# Patient Record
Sex: Male | Born: 1950 | Race: White | Hispanic: No | Marital: Married | Smoking: Former smoker
Health system: Southern US, Community
[De-identification: ages and names within clinical notes are randomized; demographics above are authoritative.]

## PROBLEM LIST (undated history)

## (undated) DIAGNOSIS — I440 Atrioventricular block, first degree: Secondary | ICD-10-CM

## (undated) DIAGNOSIS — E11319 Type 2 diabetes mellitus with unspecified diabetic retinopathy without macular edema: Secondary | ICD-10-CM

## (undated) DIAGNOSIS — E559 Vitamin D deficiency, unspecified: Secondary | ICD-10-CM

## (undated) DIAGNOSIS — K219 Gastro-esophageal reflux disease without esophagitis: Secondary | ICD-10-CM

## (undated) DIAGNOSIS — R5382 Chronic fatigue, unspecified: Secondary | ICD-10-CM

## (undated) DIAGNOSIS — M159 Polyosteoarthritis, unspecified: Secondary | ICD-10-CM

## (undated) DIAGNOSIS — N200 Calculus of kidney: Secondary | ICD-10-CM

## (undated) DIAGNOSIS — E7800 Pure hypercholesterolemia, unspecified: Secondary | ICD-10-CM

## (undated) DIAGNOSIS — D519 Vitamin B12 deficiency anemia, unspecified: Secondary | ICD-10-CM

## (undated) DIAGNOSIS — E785 Hyperlipidemia, unspecified: Secondary | ICD-10-CM

## (undated) DIAGNOSIS — E119 Type 2 diabetes mellitus without complications: Secondary | ICD-10-CM

## (undated) DIAGNOSIS — F3341 Major depressive disorder, recurrent, in partial remission: Secondary | ICD-10-CM

## (undated) DIAGNOSIS — F419 Anxiety disorder, unspecified: Secondary | ICD-10-CM

## (undated) HISTORY — DX: Calculus of kidney: N20.0

## (undated) HISTORY — DX: Pure hypercholesterolemia, unspecified: E78.00

## (undated) HISTORY — DX: Anxiety disorder, unspecified: F41.9

## (undated) HISTORY — DX: Gastro-esophageal reflux disease without esophagitis: K21.9

## (undated) HISTORY — DX: Major depressive disorder, recurrent, in partial remission: F33.41

## (undated) HISTORY — DX: Vitamin B12 deficiency anemia, unspecified: D51.9

## (undated) HISTORY — DX: Type 2 diabetes mellitus without complications: E11.9

## (undated) HISTORY — DX: Vitamin D deficiency, unspecified: E55.9

## (undated) HISTORY — DX: Chronic fatigue, unspecified: R53.82

## (undated) HISTORY — DX: Type 2 diabetes mellitus with unspecified diabetic retinopathy without macular edema: E11.319

## (undated) HISTORY — DX: Hyperlipidemia, unspecified: E78.5

## (undated) HISTORY — DX: Atrioventricular block, first degree: I44.0

## (undated) HISTORY — DX: Polyosteoarthritis, unspecified: M15.9

## (undated) HISTORY — PX: REPLACEMENT TOTAL KNEE: SUR1224

---

## 2014-05-17 DIAGNOSIS — N200 Calculus of kidney: Secondary | ICD-10-CM | POA: Diagnosis not present

## 2014-05-17 DIAGNOSIS — E780 Pure hypercholesterolemia: Secondary | ICD-10-CM | POA: Diagnosis not present

## 2014-05-17 DIAGNOSIS — K219 Gastro-esophageal reflux disease without esophagitis: Secondary | ICD-10-CM | POA: Diagnosis not present

## 2014-05-17 DIAGNOSIS — Z9181 History of falling: Secondary | ICD-10-CM | POA: Diagnosis not present

## 2014-05-17 DIAGNOSIS — Z6823 Body mass index (BMI) 23.0-23.9, adult: Secondary | ICD-10-CM | POA: Diagnosis not present

## 2014-05-17 DIAGNOSIS — Z0000 Encounter for general adult medical examination without abnormal findings: Secondary | ICD-10-CM | POA: Diagnosis not present

## 2014-05-17 DIAGNOSIS — Z1389 Encounter for screening for other disorder: Secondary | ICD-10-CM | POA: Diagnosis not present

## 2014-05-17 DIAGNOSIS — J0100 Acute maxillary sinusitis, unspecified: Secondary | ICD-10-CM | POA: Diagnosis not present

## 2014-05-17 DIAGNOSIS — M159 Polyosteoarthritis, unspecified: Secondary | ICD-10-CM | POA: Diagnosis not present

## 2014-05-17 DIAGNOSIS — E119 Type 2 diabetes mellitus without complications: Secondary | ICD-10-CM | POA: Diagnosis not present

## 2014-07-17 DIAGNOSIS — N200 Calculus of kidney: Secondary | ICD-10-CM | POA: Diagnosis not present

## 2014-07-17 DIAGNOSIS — E119 Type 2 diabetes mellitus without complications: Secondary | ICD-10-CM | POA: Diagnosis not present

## 2014-07-17 DIAGNOSIS — M159 Polyosteoarthritis, unspecified: Secondary | ICD-10-CM | POA: Diagnosis not present

## 2014-07-17 DIAGNOSIS — E780 Pure hypercholesterolemia: Secondary | ICD-10-CM | POA: Diagnosis not present

## 2014-07-17 DIAGNOSIS — K219 Gastro-esophageal reflux disease without esophagitis: Secondary | ICD-10-CM | POA: Diagnosis not present

## 2014-07-17 DIAGNOSIS — Z6823 Body mass index (BMI) 23.0-23.9, adult: Secondary | ICD-10-CM | POA: Diagnosis not present

## 2014-09-19 DIAGNOSIS — E119 Type 2 diabetes mellitus without complications: Secondary | ICD-10-CM | POA: Diagnosis not present

## 2014-09-19 DIAGNOSIS — M159 Polyosteoarthritis, unspecified: Secondary | ICD-10-CM | POA: Diagnosis not present

## 2014-09-19 DIAGNOSIS — E780 Pure hypercholesterolemia: Secondary | ICD-10-CM | POA: Diagnosis not present

## 2014-09-19 DIAGNOSIS — Z1389 Encounter for screening for other disorder: Secondary | ICD-10-CM | POA: Diagnosis not present

## 2014-09-19 DIAGNOSIS — K219 Gastro-esophageal reflux disease without esophagitis: Secondary | ICD-10-CM | POA: Diagnosis not present

## 2014-09-19 DIAGNOSIS — Z6824 Body mass index (BMI) 24.0-24.9, adult: Secondary | ICD-10-CM | POA: Diagnosis not present

## 2014-09-19 DIAGNOSIS — N200 Calculus of kidney: Secondary | ICD-10-CM | POA: Diagnosis not present

## 2014-11-12 DIAGNOSIS — H5203 Hypermetropia, bilateral: Secondary | ICD-10-CM | POA: Diagnosis not present

## 2014-11-12 DIAGNOSIS — E119 Type 2 diabetes mellitus without complications: Secondary | ICD-10-CM | POA: Diagnosis not present

## 2014-11-12 DIAGNOSIS — H5210 Myopia, unspecified eye: Secondary | ICD-10-CM | POA: Diagnosis not present

## 2014-11-21 DIAGNOSIS — M159 Polyosteoarthritis, unspecified: Secondary | ICD-10-CM | POA: Diagnosis not present

## 2014-11-21 DIAGNOSIS — Z6825 Body mass index (BMI) 25.0-25.9, adult: Secondary | ICD-10-CM | POA: Diagnosis not present

## 2014-11-21 DIAGNOSIS — E119 Type 2 diabetes mellitus without complications: Secondary | ICD-10-CM | POA: Diagnosis not present

## 2014-11-21 DIAGNOSIS — K219 Gastro-esophageal reflux disease without esophagitis: Secondary | ICD-10-CM | POA: Diagnosis not present

## 2014-11-21 DIAGNOSIS — N200 Calculus of kidney: Secondary | ICD-10-CM | POA: Diagnosis not present

## 2014-11-21 DIAGNOSIS — E780 Pure hypercholesterolemia: Secondary | ICD-10-CM | POA: Diagnosis not present

## 2014-11-21 DIAGNOSIS — M25561 Pain in right knee: Secondary | ICD-10-CM | POA: Diagnosis not present

## 2014-11-26 DIAGNOSIS — Z01818 Encounter for other preprocedural examination: Secondary | ICD-10-CM | POA: Diagnosis not present

## 2014-11-26 DIAGNOSIS — M170 Bilateral primary osteoarthritis of knee: Secondary | ICD-10-CM | POA: Diagnosis not present

## 2014-11-26 DIAGNOSIS — Z9229 Personal history of other drug therapy: Secondary | ICD-10-CM | POA: Diagnosis not present

## 2014-11-26 DIAGNOSIS — R52 Pain, unspecified: Secondary | ICD-10-CM | POA: Diagnosis not present

## 2014-11-26 DIAGNOSIS — I440 Atrioventricular block, first degree: Secondary | ICD-10-CM | POA: Diagnosis not present

## 2014-11-26 DIAGNOSIS — R001 Bradycardia, unspecified: Secondary | ICD-10-CM | POA: Diagnosis not present

## 2014-12-04 DIAGNOSIS — Z01810 Encounter for preprocedural cardiovascular examination: Secondary | ICD-10-CM | POA: Diagnosis not present

## 2014-12-04 DIAGNOSIS — Z6825 Body mass index (BMI) 25.0-25.9, adult: Secondary | ICD-10-CM | POA: Diagnosis not present

## 2014-12-04 DIAGNOSIS — E780 Pure hypercholesterolemia: Secondary | ICD-10-CM | POA: Diagnosis not present

## 2014-12-04 DIAGNOSIS — M159 Polyosteoarthritis, unspecified: Secondary | ICD-10-CM | POA: Diagnosis not present

## 2014-12-04 DIAGNOSIS — K219 Gastro-esophageal reflux disease without esophagitis: Secondary | ICD-10-CM | POA: Diagnosis not present

## 2014-12-04 DIAGNOSIS — E119 Type 2 diabetes mellitus without complications: Secondary | ICD-10-CM | POA: Diagnosis not present

## 2014-12-04 DIAGNOSIS — N200 Calculus of kidney: Secondary | ICD-10-CM | POA: Diagnosis not present

## 2014-12-12 DIAGNOSIS — M170 Bilateral primary osteoarthritis of knee: Secondary | ICD-10-CM | POA: Diagnosis not present

## 2014-12-18 DIAGNOSIS — Z471 Aftercare following joint replacement surgery: Secondary | ICD-10-CM | POA: Diagnosis not present

## 2014-12-18 DIAGNOSIS — Z87891 Personal history of nicotine dependence: Secondary | ICD-10-CM | POA: Diagnosis not present

## 2014-12-18 DIAGNOSIS — Z01818 Encounter for other preprocedural examination: Secondary | ICD-10-CM | POA: Diagnosis not present

## 2014-12-18 DIAGNOSIS — K219 Gastro-esophageal reflux disease without esophagitis: Secondary | ICD-10-CM | POA: Diagnosis not present

## 2014-12-18 DIAGNOSIS — M1712 Unilateral primary osteoarthritis, left knee: Secondary | ICD-10-CM | POA: Diagnosis not present

## 2014-12-18 DIAGNOSIS — E780 Pure hypercholesterolemia: Secondary | ICD-10-CM | POA: Diagnosis not present

## 2014-12-18 DIAGNOSIS — G8918 Other acute postprocedural pain: Secondary | ICD-10-CM | POA: Diagnosis not present

## 2014-12-18 DIAGNOSIS — M170 Bilateral primary osteoarthritis of knee: Secondary | ICD-10-CM | POA: Diagnosis not present

## 2014-12-18 DIAGNOSIS — R52 Pain, unspecified: Secondary | ICD-10-CM | POA: Diagnosis not present

## 2014-12-18 DIAGNOSIS — Z79899 Other long term (current) drug therapy: Secondary | ICD-10-CM | POA: Diagnosis not present

## 2014-12-18 DIAGNOSIS — E119 Type 2 diabetes mellitus without complications: Secondary | ICD-10-CM | POA: Diagnosis not present

## 2014-12-18 DIAGNOSIS — Z96652 Presence of left artificial knee joint: Secondary | ICD-10-CM | POA: Diagnosis not present

## 2014-12-19 DIAGNOSIS — E119 Type 2 diabetes mellitus without complications: Secondary | ICD-10-CM | POA: Diagnosis not present

## 2014-12-19 DIAGNOSIS — M170 Bilateral primary osteoarthritis of knee: Secondary | ICD-10-CM | POA: Diagnosis not present

## 2014-12-19 DIAGNOSIS — E780 Pure hypercholesterolemia: Secondary | ICD-10-CM | POA: Diagnosis not present

## 2014-12-19 DIAGNOSIS — K219 Gastro-esophageal reflux disease without esophagitis: Secondary | ICD-10-CM | POA: Diagnosis not present

## 2014-12-19 DIAGNOSIS — Z87891 Personal history of nicotine dependence: Secondary | ICD-10-CM | POA: Diagnosis not present

## 2014-12-20 DIAGNOSIS — E780 Pure hypercholesterolemia: Secondary | ICD-10-CM | POA: Diagnosis not present

## 2014-12-20 DIAGNOSIS — M170 Bilateral primary osteoarthritis of knee: Secondary | ICD-10-CM | POA: Diagnosis not present

## 2014-12-20 DIAGNOSIS — K219 Gastro-esophageal reflux disease without esophagitis: Secondary | ICD-10-CM | POA: Diagnosis not present

## 2014-12-20 DIAGNOSIS — Z87891 Personal history of nicotine dependence: Secondary | ICD-10-CM | POA: Diagnosis not present

## 2014-12-20 DIAGNOSIS — E119 Type 2 diabetes mellitus without complications: Secondary | ICD-10-CM | POA: Diagnosis not present

## 2014-12-22 DIAGNOSIS — Z471 Aftercare following joint replacement surgery: Secondary | ICD-10-CM | POA: Diagnosis not present

## 2014-12-22 DIAGNOSIS — E780 Pure hypercholesterolemia: Secondary | ICD-10-CM | POA: Diagnosis not present

## 2014-12-22 DIAGNOSIS — K219 Gastro-esophageal reflux disease without esophagitis: Secondary | ICD-10-CM | POA: Diagnosis not present

## 2014-12-22 DIAGNOSIS — R269 Unspecified abnormalities of gait and mobility: Secondary | ICD-10-CM | POA: Diagnosis not present

## 2014-12-22 DIAGNOSIS — E119 Type 2 diabetes mellitus without complications: Secondary | ICD-10-CM | POA: Diagnosis not present

## 2014-12-22 DIAGNOSIS — Z96652 Presence of left artificial knee joint: Secondary | ICD-10-CM | POA: Diagnosis not present

## 2014-12-23 DIAGNOSIS — E119 Type 2 diabetes mellitus without complications: Secondary | ICD-10-CM | POA: Diagnosis not present

## 2014-12-23 DIAGNOSIS — Z96652 Presence of left artificial knee joint: Secondary | ICD-10-CM | POA: Diagnosis not present

## 2014-12-23 DIAGNOSIS — E780 Pure hypercholesterolemia: Secondary | ICD-10-CM | POA: Diagnosis not present

## 2014-12-23 DIAGNOSIS — Z471 Aftercare following joint replacement surgery: Secondary | ICD-10-CM | POA: Diagnosis not present

## 2014-12-23 DIAGNOSIS — R269 Unspecified abnormalities of gait and mobility: Secondary | ICD-10-CM | POA: Diagnosis not present

## 2014-12-23 DIAGNOSIS — K219 Gastro-esophageal reflux disease without esophagitis: Secondary | ICD-10-CM | POA: Diagnosis not present

## 2014-12-24 DIAGNOSIS — K219 Gastro-esophageal reflux disease without esophagitis: Secondary | ICD-10-CM | POA: Diagnosis not present

## 2014-12-24 DIAGNOSIS — Z6825 Body mass index (BMI) 25.0-25.9, adult: Secondary | ICD-10-CM | POA: Diagnosis not present

## 2014-12-24 DIAGNOSIS — R269 Unspecified abnormalities of gait and mobility: Secondary | ICD-10-CM | POA: Diagnosis not present

## 2014-12-24 DIAGNOSIS — M159 Polyosteoarthritis, unspecified: Secondary | ICD-10-CM | POA: Diagnosis not present

## 2014-12-24 DIAGNOSIS — Z471 Aftercare following joint replacement surgery: Secondary | ICD-10-CM | POA: Diagnosis not present

## 2014-12-24 DIAGNOSIS — N200 Calculus of kidney: Secondary | ICD-10-CM | POA: Diagnosis not present

## 2014-12-24 DIAGNOSIS — E780 Pure hypercholesterolemia: Secondary | ICD-10-CM | POA: Diagnosis not present

## 2014-12-24 DIAGNOSIS — Z96652 Presence of left artificial knee joint: Secondary | ICD-10-CM | POA: Diagnosis not present

## 2014-12-24 DIAGNOSIS — E119 Type 2 diabetes mellitus without complications: Secondary | ICD-10-CM | POA: Diagnosis not present

## 2014-12-25 DIAGNOSIS — R269 Unspecified abnormalities of gait and mobility: Secondary | ICD-10-CM | POA: Diagnosis not present

## 2014-12-25 DIAGNOSIS — E780 Pure hypercholesterolemia: Secondary | ICD-10-CM | POA: Diagnosis not present

## 2014-12-25 DIAGNOSIS — Z471 Aftercare following joint replacement surgery: Secondary | ICD-10-CM | POA: Diagnosis not present

## 2014-12-25 DIAGNOSIS — K219 Gastro-esophageal reflux disease without esophagitis: Secondary | ICD-10-CM | POA: Diagnosis not present

## 2014-12-25 DIAGNOSIS — Z96652 Presence of left artificial knee joint: Secondary | ICD-10-CM | POA: Diagnosis not present

## 2014-12-25 DIAGNOSIS — E119 Type 2 diabetes mellitus without complications: Secondary | ICD-10-CM | POA: Diagnosis not present

## 2014-12-26 DIAGNOSIS — K219 Gastro-esophageal reflux disease without esophagitis: Secondary | ICD-10-CM | POA: Diagnosis not present

## 2014-12-26 DIAGNOSIS — Z96652 Presence of left artificial knee joint: Secondary | ICD-10-CM | POA: Diagnosis not present

## 2014-12-26 DIAGNOSIS — E780 Pure hypercholesterolemia: Secondary | ICD-10-CM | POA: Diagnosis not present

## 2014-12-26 DIAGNOSIS — E119 Type 2 diabetes mellitus without complications: Secondary | ICD-10-CM | POA: Diagnosis not present

## 2014-12-26 DIAGNOSIS — Z471 Aftercare following joint replacement surgery: Secondary | ICD-10-CM | POA: Diagnosis not present

## 2014-12-26 DIAGNOSIS — R269 Unspecified abnormalities of gait and mobility: Secondary | ICD-10-CM | POA: Diagnosis not present

## 2014-12-27 DIAGNOSIS — R269 Unspecified abnormalities of gait and mobility: Secondary | ICD-10-CM | POA: Diagnosis not present

## 2014-12-27 DIAGNOSIS — Z96652 Presence of left artificial knee joint: Secondary | ICD-10-CM | POA: Diagnosis not present

## 2014-12-27 DIAGNOSIS — K219 Gastro-esophageal reflux disease without esophagitis: Secondary | ICD-10-CM | POA: Diagnosis not present

## 2014-12-27 DIAGNOSIS — E119 Type 2 diabetes mellitus without complications: Secondary | ICD-10-CM | POA: Diagnosis not present

## 2014-12-27 DIAGNOSIS — Z471 Aftercare following joint replacement surgery: Secondary | ICD-10-CM | POA: Diagnosis not present

## 2014-12-27 DIAGNOSIS — E780 Pure hypercholesterolemia: Secondary | ICD-10-CM | POA: Diagnosis not present

## 2014-12-30 DIAGNOSIS — K219 Gastro-esophageal reflux disease without esophagitis: Secondary | ICD-10-CM | POA: Diagnosis not present

## 2014-12-30 DIAGNOSIS — Z96652 Presence of left artificial knee joint: Secondary | ICD-10-CM | POA: Diagnosis not present

## 2014-12-30 DIAGNOSIS — E780 Pure hypercholesterolemia: Secondary | ICD-10-CM | POA: Diagnosis not present

## 2014-12-30 DIAGNOSIS — Z471 Aftercare following joint replacement surgery: Secondary | ICD-10-CM | POA: Diagnosis not present

## 2014-12-30 DIAGNOSIS — R269 Unspecified abnormalities of gait and mobility: Secondary | ICD-10-CM | POA: Diagnosis not present

## 2014-12-30 DIAGNOSIS — E119 Type 2 diabetes mellitus without complications: Secondary | ICD-10-CM | POA: Diagnosis not present

## 2015-01-01 DIAGNOSIS — Z471 Aftercare following joint replacement surgery: Secondary | ICD-10-CM | POA: Diagnosis not present

## 2015-01-01 DIAGNOSIS — R269 Unspecified abnormalities of gait and mobility: Secondary | ICD-10-CM | POA: Diagnosis not present

## 2015-01-01 DIAGNOSIS — M170 Bilateral primary osteoarthritis of knee: Secondary | ICD-10-CM | POA: Diagnosis not present

## 2015-01-01 DIAGNOSIS — Z96652 Presence of left artificial knee joint: Secondary | ICD-10-CM | POA: Diagnosis not present

## 2015-01-03 DIAGNOSIS — R269 Unspecified abnormalities of gait and mobility: Secondary | ICD-10-CM | POA: Diagnosis not present

## 2015-01-03 DIAGNOSIS — Z96652 Presence of left artificial knee joint: Secondary | ICD-10-CM | POA: Diagnosis not present

## 2015-01-03 DIAGNOSIS — Z471 Aftercare following joint replacement surgery: Secondary | ICD-10-CM | POA: Diagnosis not present

## 2015-01-03 DIAGNOSIS — M170 Bilateral primary osteoarthritis of knee: Secondary | ICD-10-CM | POA: Diagnosis not present

## 2015-01-07 DIAGNOSIS — M170 Bilateral primary osteoarthritis of knee: Secondary | ICD-10-CM | POA: Diagnosis not present

## 2015-01-07 DIAGNOSIS — N200 Calculus of kidney: Secondary | ICD-10-CM | POA: Diagnosis not present

## 2015-01-07 DIAGNOSIS — M159 Polyosteoarthritis, unspecified: Secondary | ICD-10-CM | POA: Diagnosis not present

## 2015-01-07 DIAGNOSIS — K219 Gastro-esophageal reflux disease without esophagitis: Secondary | ICD-10-CM | POA: Diagnosis not present

## 2015-01-07 DIAGNOSIS — Z96652 Presence of left artificial knee joint: Secondary | ICD-10-CM | POA: Diagnosis not present

## 2015-01-07 DIAGNOSIS — E780 Pure hypercholesterolemia: Secondary | ICD-10-CM | POA: Diagnosis not present

## 2015-01-07 DIAGNOSIS — Z471 Aftercare following joint replacement surgery: Secondary | ICD-10-CM | POA: Diagnosis not present

## 2015-01-07 DIAGNOSIS — E119 Type 2 diabetes mellitus without complications: Secondary | ICD-10-CM | POA: Diagnosis not present

## 2015-01-07 DIAGNOSIS — Z6824 Body mass index (BMI) 24.0-24.9, adult: Secondary | ICD-10-CM | POA: Diagnosis not present

## 2015-01-07 DIAGNOSIS — R269 Unspecified abnormalities of gait and mobility: Secondary | ICD-10-CM | POA: Diagnosis not present

## 2015-01-10 DIAGNOSIS — Z471 Aftercare following joint replacement surgery: Secondary | ICD-10-CM | POA: Diagnosis not present

## 2015-01-10 DIAGNOSIS — Z96652 Presence of left artificial knee joint: Secondary | ICD-10-CM | POA: Diagnosis not present

## 2015-01-10 DIAGNOSIS — M170 Bilateral primary osteoarthritis of knee: Secondary | ICD-10-CM | POA: Diagnosis not present

## 2015-01-10 DIAGNOSIS — R269 Unspecified abnormalities of gait and mobility: Secondary | ICD-10-CM | POA: Diagnosis not present

## 2015-01-14 DIAGNOSIS — R269 Unspecified abnormalities of gait and mobility: Secondary | ICD-10-CM | POA: Diagnosis not present

## 2015-01-14 DIAGNOSIS — M170 Bilateral primary osteoarthritis of knee: Secondary | ICD-10-CM | POA: Diagnosis not present

## 2015-01-14 DIAGNOSIS — Z471 Aftercare following joint replacement surgery: Secondary | ICD-10-CM | POA: Diagnosis not present

## 2015-01-14 DIAGNOSIS — Z96652 Presence of left artificial knee joint: Secondary | ICD-10-CM | POA: Diagnosis not present

## 2015-01-16 DIAGNOSIS — Z471 Aftercare following joint replacement surgery: Secondary | ICD-10-CM | POA: Diagnosis not present

## 2015-01-16 DIAGNOSIS — R269 Unspecified abnormalities of gait and mobility: Secondary | ICD-10-CM | POA: Diagnosis not present

## 2015-01-16 DIAGNOSIS — M170 Bilateral primary osteoarthritis of knee: Secondary | ICD-10-CM | POA: Diagnosis not present

## 2015-01-16 DIAGNOSIS — Z96652 Presence of left artificial knee joint: Secondary | ICD-10-CM | POA: Diagnosis not present

## 2015-01-20 DIAGNOSIS — R269 Unspecified abnormalities of gait and mobility: Secondary | ICD-10-CM | POA: Diagnosis not present

## 2015-01-20 DIAGNOSIS — Z471 Aftercare following joint replacement surgery: Secondary | ICD-10-CM | POA: Diagnosis not present

## 2015-01-20 DIAGNOSIS — M170 Bilateral primary osteoarthritis of knee: Secondary | ICD-10-CM | POA: Diagnosis not present

## 2015-01-20 DIAGNOSIS — Z96652 Presence of left artificial knee joint: Secondary | ICD-10-CM | POA: Diagnosis not present

## 2015-01-21 DIAGNOSIS — N200 Calculus of kidney: Secondary | ICD-10-CM | POA: Diagnosis not present

## 2015-01-21 DIAGNOSIS — E780 Pure hypercholesterolemia: Secondary | ICD-10-CM | POA: Diagnosis not present

## 2015-01-21 DIAGNOSIS — M159 Polyosteoarthritis, unspecified: Secondary | ICD-10-CM | POA: Diagnosis not present

## 2015-01-21 DIAGNOSIS — E119 Type 2 diabetes mellitus without complications: Secondary | ICD-10-CM | POA: Diagnosis not present

## 2015-01-21 DIAGNOSIS — Z6824 Body mass index (BMI) 24.0-24.9, adult: Secondary | ICD-10-CM | POA: Diagnosis not present

## 2015-01-21 DIAGNOSIS — K219 Gastro-esophageal reflux disease without esophagitis: Secondary | ICD-10-CM | POA: Diagnosis not present

## 2015-01-23 DIAGNOSIS — Z96652 Presence of left artificial knee joint: Secondary | ICD-10-CM | POA: Diagnosis not present

## 2015-01-23 DIAGNOSIS — R269 Unspecified abnormalities of gait and mobility: Secondary | ICD-10-CM | POA: Diagnosis not present

## 2015-01-23 DIAGNOSIS — M170 Bilateral primary osteoarthritis of knee: Secondary | ICD-10-CM | POA: Diagnosis not present

## 2015-01-23 DIAGNOSIS — Z471 Aftercare following joint replacement surgery: Secondary | ICD-10-CM | POA: Diagnosis not present

## 2015-01-27 DIAGNOSIS — Z96652 Presence of left artificial knee joint: Secondary | ICD-10-CM | POA: Diagnosis not present

## 2015-01-27 DIAGNOSIS — M170 Bilateral primary osteoarthritis of knee: Secondary | ICD-10-CM | POA: Diagnosis not present

## 2015-01-27 DIAGNOSIS — R269 Unspecified abnormalities of gait and mobility: Secondary | ICD-10-CM | POA: Diagnosis not present

## 2015-01-27 DIAGNOSIS — Z471 Aftercare following joint replacement surgery: Secondary | ICD-10-CM | POA: Diagnosis not present

## 2015-01-28 DIAGNOSIS — Z96652 Presence of left artificial knee joint: Secondary | ICD-10-CM | POA: Diagnosis not present

## 2015-01-29 DIAGNOSIS — Z471 Aftercare following joint replacement surgery: Secondary | ICD-10-CM | POA: Diagnosis not present

## 2015-01-29 DIAGNOSIS — R269 Unspecified abnormalities of gait and mobility: Secondary | ICD-10-CM | POA: Diagnosis not present

## 2015-01-29 DIAGNOSIS — Z96652 Presence of left artificial knee joint: Secondary | ICD-10-CM | POA: Diagnosis not present

## 2015-01-29 DIAGNOSIS — M170 Bilateral primary osteoarthritis of knee: Secondary | ICD-10-CM | POA: Diagnosis not present

## 2015-02-03 DIAGNOSIS — R269 Unspecified abnormalities of gait and mobility: Secondary | ICD-10-CM | POA: Diagnosis not present

## 2015-02-03 DIAGNOSIS — Z96652 Presence of left artificial knee joint: Secondary | ICD-10-CM | POA: Diagnosis not present

## 2015-02-03 DIAGNOSIS — M170 Bilateral primary osteoarthritis of knee: Secondary | ICD-10-CM | POA: Diagnosis not present

## 2015-02-03 DIAGNOSIS — Z471 Aftercare following joint replacement surgery: Secondary | ICD-10-CM | POA: Diagnosis not present

## 2015-02-04 DIAGNOSIS — E119 Type 2 diabetes mellitus without complications: Secondary | ICD-10-CM | POA: Diagnosis not present

## 2015-02-04 DIAGNOSIS — K219 Gastro-esophageal reflux disease without esophagitis: Secondary | ICD-10-CM | POA: Diagnosis not present

## 2015-02-04 DIAGNOSIS — Z6825 Body mass index (BMI) 25.0-25.9, adult: Secondary | ICD-10-CM | POA: Diagnosis not present

## 2015-02-04 DIAGNOSIS — N200 Calculus of kidney: Secondary | ICD-10-CM | POA: Diagnosis not present

## 2015-02-04 DIAGNOSIS — Z23 Encounter for immunization: Secondary | ICD-10-CM | POA: Diagnosis not present

## 2015-02-04 DIAGNOSIS — M159 Polyosteoarthritis, unspecified: Secondary | ICD-10-CM | POA: Diagnosis not present

## 2015-02-05 DIAGNOSIS — Z96652 Presence of left artificial knee joint: Secondary | ICD-10-CM | POA: Diagnosis not present

## 2015-02-05 DIAGNOSIS — M170 Bilateral primary osteoarthritis of knee: Secondary | ICD-10-CM | POA: Diagnosis not present

## 2015-02-05 DIAGNOSIS — R269 Unspecified abnormalities of gait and mobility: Secondary | ICD-10-CM | POA: Diagnosis not present

## 2015-02-05 DIAGNOSIS — Z471 Aftercare following joint replacement surgery: Secondary | ICD-10-CM | POA: Diagnosis not present

## 2015-03-04 DIAGNOSIS — E7800 Pure hypercholesterolemia, unspecified: Secondary | ICD-10-CM | POA: Diagnosis not present

## 2015-03-04 DIAGNOSIS — E119 Type 2 diabetes mellitus without complications: Secondary | ICD-10-CM | POA: Diagnosis not present

## 2015-03-04 DIAGNOSIS — M159 Polyosteoarthritis, unspecified: Secondary | ICD-10-CM | POA: Diagnosis not present

## 2015-03-04 DIAGNOSIS — K219 Gastro-esophageal reflux disease without esophagitis: Secondary | ICD-10-CM | POA: Diagnosis not present

## 2015-03-04 DIAGNOSIS — Z6826 Body mass index (BMI) 26.0-26.9, adult: Secondary | ICD-10-CM | POA: Diagnosis not present

## 2015-03-04 DIAGNOSIS — F419 Anxiety disorder, unspecified: Secondary | ICD-10-CM | POA: Diagnosis not present

## 2015-03-04 DIAGNOSIS — N200 Calculus of kidney: Secondary | ICD-10-CM | POA: Diagnosis not present

## 2015-04-03 DIAGNOSIS — Z6826 Body mass index (BMI) 26.0-26.9, adult: Secondary | ICD-10-CM | POA: Diagnosis not present

## 2015-04-03 DIAGNOSIS — Z1389 Encounter for screening for other disorder: Secondary | ICD-10-CM | POA: Diagnosis not present

## 2015-04-03 DIAGNOSIS — E7800 Pure hypercholesterolemia, unspecified: Secondary | ICD-10-CM | POA: Diagnosis not present

## 2015-04-03 DIAGNOSIS — N200 Calculus of kidney: Secondary | ICD-10-CM | POA: Diagnosis not present

## 2015-04-03 DIAGNOSIS — M159 Polyosteoarthritis, unspecified: Secondary | ICD-10-CM | POA: Diagnosis not present

## 2015-04-03 DIAGNOSIS — K219 Gastro-esophageal reflux disease without esophagitis: Secondary | ICD-10-CM | POA: Diagnosis not present

## 2015-04-03 DIAGNOSIS — E119 Type 2 diabetes mellitus without complications: Secondary | ICD-10-CM | POA: Diagnosis not present

## 2015-04-03 DIAGNOSIS — F419 Anxiety disorder, unspecified: Secondary | ICD-10-CM | POA: Diagnosis not present

## 2015-05-01 DIAGNOSIS — Z471 Aftercare following joint replacement surgery: Secondary | ICD-10-CM | POA: Diagnosis not present

## 2015-05-01 DIAGNOSIS — Z96652 Presence of left artificial knee joint: Secondary | ICD-10-CM | POA: Diagnosis not present

## 2015-07-02 DIAGNOSIS — K219 Gastro-esophageal reflux disease without esophagitis: Secondary | ICD-10-CM | POA: Diagnosis not present

## 2015-07-02 DIAGNOSIS — Z6827 Body mass index (BMI) 27.0-27.9, adult: Secondary | ICD-10-CM | POA: Diagnosis not present

## 2015-07-02 DIAGNOSIS — E7800 Pure hypercholesterolemia, unspecified: Secondary | ICD-10-CM | POA: Diagnosis not present

## 2015-07-02 DIAGNOSIS — M159 Polyosteoarthritis, unspecified: Secondary | ICD-10-CM | POA: Diagnosis not present

## 2015-07-02 DIAGNOSIS — E119 Type 2 diabetes mellitus without complications: Secondary | ICD-10-CM | POA: Diagnosis not present

## 2015-07-02 DIAGNOSIS — N200 Calculus of kidney: Secondary | ICD-10-CM | POA: Diagnosis not present

## 2015-07-02 DIAGNOSIS — F419 Anxiety disorder, unspecified: Secondary | ICD-10-CM | POA: Diagnosis not present

## 2015-07-29 DIAGNOSIS — Z96652 Presence of left artificial knee joint: Secondary | ICD-10-CM | POA: Diagnosis not present

## 2015-10-02 DIAGNOSIS — F419 Anxiety disorder, unspecified: Secondary | ICD-10-CM | POA: Diagnosis not present

## 2015-10-02 DIAGNOSIS — E7800 Pure hypercholesterolemia, unspecified: Secondary | ICD-10-CM | POA: Diagnosis not present

## 2015-10-02 DIAGNOSIS — K219 Gastro-esophageal reflux disease without esophagitis: Secondary | ICD-10-CM | POA: Diagnosis not present

## 2015-10-02 DIAGNOSIS — M159 Polyosteoarthritis, unspecified: Secondary | ICD-10-CM | POA: Diagnosis not present

## 2015-10-02 DIAGNOSIS — E663 Overweight: Secondary | ICD-10-CM | POA: Diagnosis not present

## 2015-10-02 DIAGNOSIS — Z6827 Body mass index (BMI) 27.0-27.9, adult: Secondary | ICD-10-CM | POA: Diagnosis not present

## 2015-10-02 DIAGNOSIS — N200 Calculus of kidney: Secondary | ICD-10-CM | POA: Diagnosis not present

## 2015-10-02 DIAGNOSIS — E119 Type 2 diabetes mellitus without complications: Secondary | ICD-10-CM | POA: Diagnosis not present

## 2015-11-28 DIAGNOSIS — H5211 Myopia, right eye: Secondary | ICD-10-CM | POA: Diagnosis not present

## 2015-11-28 DIAGNOSIS — H5210 Myopia, unspecified eye: Secondary | ICD-10-CM | POA: Diagnosis not present

## 2016-01-02 DIAGNOSIS — K219 Gastro-esophageal reflux disease without esophagitis: Secondary | ICD-10-CM | POA: Diagnosis not present

## 2016-01-02 DIAGNOSIS — N200 Calculus of kidney: Secondary | ICD-10-CM | POA: Diagnosis not present

## 2016-01-02 DIAGNOSIS — Z9181 History of falling: Secondary | ICD-10-CM | POA: Diagnosis not present

## 2016-01-02 DIAGNOSIS — Z6827 Body mass index (BMI) 27.0-27.9, adult: Secondary | ICD-10-CM | POA: Diagnosis not present

## 2016-01-02 DIAGNOSIS — E119 Type 2 diabetes mellitus without complications: Secondary | ICD-10-CM | POA: Diagnosis not present

## 2016-01-02 DIAGNOSIS — F419 Anxiety disorder, unspecified: Secondary | ICD-10-CM | POA: Diagnosis not present

## 2016-01-02 DIAGNOSIS — M159 Polyosteoarthritis, unspecified: Secondary | ICD-10-CM | POA: Diagnosis not present

## 2016-01-02 DIAGNOSIS — E7800 Pure hypercholesterolemia, unspecified: Secondary | ICD-10-CM | POA: Diagnosis not present

## 2016-04-08 DIAGNOSIS — N200 Calculus of kidney: Secondary | ICD-10-CM | POA: Diagnosis not present

## 2016-04-08 DIAGNOSIS — K219 Gastro-esophageal reflux disease without esophagitis: Secondary | ICD-10-CM | POA: Diagnosis not present

## 2016-04-08 DIAGNOSIS — F419 Anxiety disorder, unspecified: Secondary | ICD-10-CM | POA: Diagnosis not present

## 2016-04-08 DIAGNOSIS — E7800 Pure hypercholesterolemia, unspecified: Secondary | ICD-10-CM | POA: Diagnosis not present

## 2016-04-08 DIAGNOSIS — M159 Polyosteoarthritis, unspecified: Secondary | ICD-10-CM | POA: Diagnosis not present

## 2016-04-08 DIAGNOSIS — Z6827 Body mass index (BMI) 27.0-27.9, adult: Secondary | ICD-10-CM | POA: Diagnosis not present

## 2016-04-08 DIAGNOSIS — E663 Overweight: Secondary | ICD-10-CM | POA: Diagnosis not present

## 2016-04-08 DIAGNOSIS — E119 Type 2 diabetes mellitus without complications: Secondary | ICD-10-CM | POA: Diagnosis not present

## 2016-07-13 DIAGNOSIS — M159 Polyosteoarthritis, unspecified: Secondary | ICD-10-CM | POA: Diagnosis not present

## 2016-07-13 DIAGNOSIS — N200 Calculus of kidney: Secondary | ICD-10-CM | POA: Diagnosis not present

## 2016-07-13 DIAGNOSIS — Z6827 Body mass index (BMI) 27.0-27.9, adult: Secondary | ICD-10-CM | POA: Diagnosis not present

## 2016-07-13 DIAGNOSIS — E119 Type 2 diabetes mellitus without complications: Secondary | ICD-10-CM | POA: Diagnosis not present

## 2016-07-13 DIAGNOSIS — Z1389 Encounter for screening for other disorder: Secondary | ICD-10-CM | POA: Diagnosis not present

## 2016-07-13 DIAGNOSIS — E7800 Pure hypercholesterolemia, unspecified: Secondary | ICD-10-CM | POA: Diagnosis not present

## 2016-07-13 DIAGNOSIS — F419 Anxiety disorder, unspecified: Secondary | ICD-10-CM | POA: Diagnosis not present

## 2016-07-13 DIAGNOSIS — K219 Gastro-esophageal reflux disease without esophagitis: Secondary | ICD-10-CM | POA: Diagnosis not present

## 2016-10-13 DIAGNOSIS — K219 Gastro-esophageal reflux disease without esophagitis: Secondary | ICD-10-CM | POA: Diagnosis not present

## 2016-10-13 DIAGNOSIS — F419 Anxiety disorder, unspecified: Secondary | ICD-10-CM | POA: Diagnosis not present

## 2016-10-13 DIAGNOSIS — Z6827 Body mass index (BMI) 27.0-27.9, adult: Secondary | ICD-10-CM | POA: Diagnosis not present

## 2016-10-13 DIAGNOSIS — E119 Type 2 diabetes mellitus without complications: Secondary | ICD-10-CM | POA: Diagnosis not present

## 2016-10-13 DIAGNOSIS — Z139 Encounter for screening, unspecified: Secondary | ICD-10-CM | POA: Diagnosis not present

## 2016-10-13 DIAGNOSIS — M159 Polyosteoarthritis, unspecified: Secondary | ICD-10-CM | POA: Diagnosis not present

## 2016-10-13 DIAGNOSIS — E663 Overweight: Secondary | ICD-10-CM | POA: Diagnosis not present

## 2016-10-13 DIAGNOSIS — E7800 Pure hypercholesterolemia, unspecified: Secondary | ICD-10-CM | POA: Diagnosis not present

## 2016-10-13 DIAGNOSIS — N200 Calculus of kidney: Secondary | ICD-10-CM | POA: Diagnosis not present

## 2016-11-30 DIAGNOSIS — E113293 Type 2 diabetes mellitus with mild nonproliferative diabetic retinopathy without macular edema, bilateral: Secondary | ICD-10-CM | POA: Diagnosis not present

## 2016-11-30 DIAGNOSIS — H5203 Hypermetropia, bilateral: Secondary | ICD-10-CM | POA: Diagnosis not present

## 2016-11-30 DIAGNOSIS — Z0100 Encounter for examination of eyes and vision without abnormal findings: Secondary | ICD-10-CM | POA: Diagnosis not present

## 2017-01-13 DIAGNOSIS — E7800 Pure hypercholesterolemia, unspecified: Secondary | ICD-10-CM | POA: Diagnosis not present

## 2017-01-13 DIAGNOSIS — M159 Polyosteoarthritis, unspecified: Secondary | ICD-10-CM | POA: Diagnosis not present

## 2017-01-13 DIAGNOSIS — K219 Gastro-esophageal reflux disease without esophagitis: Secondary | ICD-10-CM | POA: Diagnosis not present

## 2017-01-13 DIAGNOSIS — N200 Calculus of kidney: Secondary | ICD-10-CM | POA: Diagnosis not present

## 2017-01-13 DIAGNOSIS — E119 Type 2 diabetes mellitus without complications: Secondary | ICD-10-CM | POA: Diagnosis not present

## 2017-01-13 DIAGNOSIS — Z6827 Body mass index (BMI) 27.0-27.9, adult: Secondary | ICD-10-CM | POA: Diagnosis not present

## 2017-01-13 DIAGNOSIS — F419 Anxiety disorder, unspecified: Secondary | ICD-10-CM | POA: Diagnosis not present

## 2017-01-18 DIAGNOSIS — Z1211 Encounter for screening for malignant neoplasm of colon: Secondary | ICD-10-CM | POA: Diagnosis not present

## 2017-01-18 DIAGNOSIS — Z86010 Personal history of colonic polyps: Secondary | ICD-10-CM | POA: Diagnosis not present

## 2017-01-18 DIAGNOSIS — R1310 Dysphagia, unspecified: Secondary | ICD-10-CM | POA: Diagnosis not present

## 2017-01-24 DIAGNOSIS — R1310 Dysphagia, unspecified: Secondary | ICD-10-CM | POA: Diagnosis not present

## 2017-01-24 DIAGNOSIS — K224 Dyskinesia of esophagus: Secondary | ICD-10-CM | POA: Diagnosis not present

## 2017-02-02 DIAGNOSIS — K219 Gastro-esophageal reflux disease without esophagitis: Secondary | ICD-10-CM | POA: Diagnosis not present

## 2017-02-02 DIAGNOSIS — K222 Esophageal obstruction: Secondary | ICD-10-CM | POA: Diagnosis not present

## 2017-02-02 DIAGNOSIS — K449 Diaphragmatic hernia without obstruction or gangrene: Secondary | ICD-10-CM | POA: Diagnosis not present

## 2017-02-02 DIAGNOSIS — K2970 Gastritis, unspecified, without bleeding: Secondary | ICD-10-CM | POA: Diagnosis not present

## 2017-02-02 DIAGNOSIS — R1310 Dysphagia, unspecified: Secondary | ICD-10-CM | POA: Diagnosis not present

## 2017-02-02 DIAGNOSIS — Q393 Congenital stenosis and stricture of esophagus: Secondary | ICD-10-CM | POA: Diagnosis not present

## 2017-02-02 DIAGNOSIS — Z86010 Personal history of colonic polyps: Secondary | ICD-10-CM | POA: Diagnosis not present

## 2017-02-02 DIAGNOSIS — K5730 Diverticulosis of large intestine without perforation or abscess without bleeding: Secondary | ICD-10-CM | POA: Diagnosis not present

## 2017-02-02 DIAGNOSIS — Z1211 Encounter for screening for malignant neoplasm of colon: Secondary | ICD-10-CM | POA: Diagnosis not present

## 2017-02-02 DIAGNOSIS — K648 Other hemorrhoids: Secondary | ICD-10-CM | POA: Diagnosis not present

## 2017-02-02 DIAGNOSIS — K2900 Acute gastritis without bleeding: Secondary | ICD-10-CM | POA: Diagnosis not present

## 2017-04-14 DIAGNOSIS — K219 Gastro-esophageal reflux disease without esophagitis: Secondary | ICD-10-CM | POA: Diagnosis not present

## 2017-04-14 DIAGNOSIS — F419 Anxiety disorder, unspecified: Secondary | ICD-10-CM | POA: Diagnosis not present

## 2017-04-14 DIAGNOSIS — E7800 Pure hypercholesterolemia, unspecified: Secondary | ICD-10-CM | POA: Diagnosis not present

## 2017-04-14 DIAGNOSIS — E119 Type 2 diabetes mellitus without complications: Secondary | ICD-10-CM | POA: Diagnosis not present

## 2017-04-14 DIAGNOSIS — M159 Polyosteoarthritis, unspecified: Secondary | ICD-10-CM | POA: Diagnosis not present

## 2017-04-14 DIAGNOSIS — N200 Calculus of kidney: Secondary | ICD-10-CM | POA: Diagnosis not present

## 2017-04-14 DIAGNOSIS — Z9181 History of falling: Secondary | ICD-10-CM | POA: Diagnosis not present

## 2017-04-14 DIAGNOSIS — Z6828 Body mass index (BMI) 28.0-28.9, adult: Secondary | ICD-10-CM | POA: Diagnosis not present

## 2017-08-10 DIAGNOSIS — M159 Polyosteoarthritis, unspecified: Secondary | ICD-10-CM | POA: Diagnosis not present

## 2017-08-10 DIAGNOSIS — K219 Gastro-esophageal reflux disease without esophagitis: Secondary | ICD-10-CM | POA: Diagnosis not present

## 2017-08-10 DIAGNOSIS — E7800 Pure hypercholesterolemia, unspecified: Secondary | ICD-10-CM | POA: Diagnosis not present

## 2017-08-10 DIAGNOSIS — Z1331 Encounter for screening for depression: Secondary | ICD-10-CM | POA: Diagnosis not present

## 2017-08-10 DIAGNOSIS — R5382 Chronic fatigue, unspecified: Secondary | ICD-10-CM | POA: Diagnosis not present

## 2017-08-10 DIAGNOSIS — Z6827 Body mass index (BMI) 27.0-27.9, adult: Secondary | ICD-10-CM | POA: Diagnosis not present

## 2017-08-10 DIAGNOSIS — N200 Calculus of kidney: Secondary | ICD-10-CM | POA: Diagnosis not present

## 2017-08-10 DIAGNOSIS — F419 Anxiety disorder, unspecified: Secondary | ICD-10-CM | POA: Diagnosis not present

## 2017-08-10 DIAGNOSIS — E119 Type 2 diabetes mellitus without complications: Secondary | ICD-10-CM | POA: Diagnosis not present

## 2017-08-10 DIAGNOSIS — Z1389 Encounter for screening for other disorder: Secondary | ICD-10-CM | POA: Diagnosis not present

## 2017-11-09 DIAGNOSIS — M159 Polyosteoarthritis, unspecified: Secondary | ICD-10-CM | POA: Diagnosis not present

## 2017-11-09 DIAGNOSIS — F419 Anxiety disorder, unspecified: Secondary | ICD-10-CM | POA: Diagnosis not present

## 2017-11-09 DIAGNOSIS — N200 Calculus of kidney: Secondary | ICD-10-CM | POA: Diagnosis not present

## 2017-11-09 DIAGNOSIS — E7800 Pure hypercholesterolemia, unspecified: Secondary | ICD-10-CM | POA: Diagnosis not present

## 2017-11-09 DIAGNOSIS — E119 Type 2 diabetes mellitus without complications: Secondary | ICD-10-CM | POA: Diagnosis not present

## 2017-11-09 DIAGNOSIS — J0100 Acute maxillary sinusitis, unspecified: Secondary | ICD-10-CM | POA: Diagnosis not present

## 2017-11-09 DIAGNOSIS — Z6828 Body mass index (BMI) 28.0-28.9, adult: Secondary | ICD-10-CM | POA: Diagnosis not present

## 2017-11-09 DIAGNOSIS — K219 Gastro-esophageal reflux disease without esophagitis: Secondary | ICD-10-CM | POA: Diagnosis not present

## 2017-12-01 DIAGNOSIS — E113293 Type 2 diabetes mellitus with mild nonproliferative diabetic retinopathy without macular edema, bilateral: Secondary | ICD-10-CM | POA: Diagnosis not present

## 2018-02-13 DIAGNOSIS — Z6828 Body mass index (BMI) 28.0-28.9, adult: Secondary | ICD-10-CM | POA: Diagnosis not present

## 2018-02-13 DIAGNOSIS — Z1339 Encounter for screening examination for other mental health and behavioral disorders: Secondary | ICD-10-CM | POA: Diagnosis not present

## 2018-02-13 DIAGNOSIS — E7800 Pure hypercholesterolemia, unspecified: Secondary | ICD-10-CM | POA: Diagnosis not present

## 2018-02-13 DIAGNOSIS — F3341 Major depressive disorder, recurrent, in partial remission: Secondary | ICD-10-CM | POA: Diagnosis not present

## 2018-02-13 DIAGNOSIS — K219 Gastro-esophageal reflux disease without esophagitis: Secondary | ICD-10-CM | POA: Diagnosis not present

## 2018-02-13 DIAGNOSIS — E118 Type 2 diabetes mellitus with unspecified complications: Secondary | ICD-10-CM | POA: Diagnosis not present

## 2018-02-13 DIAGNOSIS — N200 Calculus of kidney: Secondary | ICD-10-CM | POA: Diagnosis not present

## 2018-02-13 DIAGNOSIS — F419 Anxiety disorder, unspecified: Secondary | ICD-10-CM | POA: Diagnosis not present

## 2018-02-13 DIAGNOSIS — M159 Polyosteoarthritis, unspecified: Secondary | ICD-10-CM | POA: Diagnosis not present

## 2018-03-11 DIAGNOSIS — R350 Frequency of micturition: Secondary | ICD-10-CM | POA: Diagnosis not present

## 2018-03-11 DIAGNOSIS — R300 Dysuria: Secondary | ICD-10-CM | POA: Diagnosis not present

## 2018-03-11 DIAGNOSIS — R1032 Left lower quadrant pain: Secondary | ICD-10-CM | POA: Diagnosis not present

## 2018-05-16 DIAGNOSIS — Z6827 Body mass index (BMI) 27.0-27.9, adult: Secondary | ICD-10-CM | POA: Diagnosis not present

## 2018-05-16 DIAGNOSIS — N200 Calculus of kidney: Secondary | ICD-10-CM | POA: Diagnosis not present

## 2018-05-16 DIAGNOSIS — K219 Gastro-esophageal reflux disease without esophagitis: Secondary | ICD-10-CM | POA: Diagnosis not present

## 2018-05-16 DIAGNOSIS — E118 Type 2 diabetes mellitus with unspecified complications: Secondary | ICD-10-CM | POA: Diagnosis not present

## 2018-05-16 DIAGNOSIS — F419 Anxiety disorder, unspecified: Secondary | ICD-10-CM | POA: Diagnosis not present

## 2018-05-16 DIAGNOSIS — E7800 Pure hypercholesterolemia, unspecified: Secondary | ICD-10-CM | POA: Diagnosis not present

## 2018-05-16 DIAGNOSIS — M159 Polyosteoarthritis, unspecified: Secondary | ICD-10-CM | POA: Diagnosis not present

## 2018-05-16 DIAGNOSIS — F3341 Major depressive disorder, recurrent, in partial remission: Secondary | ICD-10-CM | POA: Diagnosis not present

## 2018-08-22 DIAGNOSIS — F419 Anxiety disorder, unspecified: Secondary | ICD-10-CM | POA: Diagnosis not present

## 2018-08-22 DIAGNOSIS — F3341 Major depressive disorder, recurrent, in partial remission: Secondary | ICD-10-CM | POA: Diagnosis not present

## 2018-08-22 DIAGNOSIS — Z6827 Body mass index (BMI) 27.0-27.9, adult: Secondary | ICD-10-CM | POA: Diagnosis not present

## 2018-08-22 DIAGNOSIS — K219 Gastro-esophageal reflux disease without esophagitis: Secondary | ICD-10-CM | POA: Diagnosis not present

## 2018-08-22 DIAGNOSIS — E118 Type 2 diabetes mellitus with unspecified complications: Secondary | ICD-10-CM | POA: Diagnosis not present

## 2018-08-22 DIAGNOSIS — E7800 Pure hypercholesterolemia, unspecified: Secondary | ICD-10-CM | POA: Diagnosis not present

## 2018-08-22 DIAGNOSIS — N200 Calculus of kidney: Secondary | ICD-10-CM | POA: Diagnosis not present

## 2018-08-22 DIAGNOSIS — M159 Polyosteoarthritis, unspecified: Secondary | ICD-10-CM | POA: Diagnosis not present

## 2018-11-21 DIAGNOSIS — K219 Gastro-esophageal reflux disease without esophagitis: Secondary | ICD-10-CM | POA: Diagnosis not present

## 2018-11-21 DIAGNOSIS — F419 Anxiety disorder, unspecified: Secondary | ICD-10-CM | POA: Diagnosis not present

## 2018-11-21 DIAGNOSIS — E663 Overweight: Secondary | ICD-10-CM | POA: Diagnosis not present

## 2018-11-21 DIAGNOSIS — M159 Polyosteoarthritis, unspecified: Secondary | ICD-10-CM | POA: Diagnosis not present

## 2018-11-21 DIAGNOSIS — Z139 Encounter for screening, unspecified: Secondary | ICD-10-CM | POA: Diagnosis not present

## 2018-11-21 DIAGNOSIS — E118 Type 2 diabetes mellitus with unspecified complications: Secondary | ICD-10-CM | POA: Diagnosis not present

## 2018-11-21 DIAGNOSIS — N200 Calculus of kidney: Secondary | ICD-10-CM | POA: Diagnosis not present

## 2018-11-21 DIAGNOSIS — E7800 Pure hypercholesterolemia, unspecified: Secondary | ICD-10-CM | POA: Diagnosis not present

## 2018-11-21 DIAGNOSIS — F3341 Major depressive disorder, recurrent, in partial remission: Secondary | ICD-10-CM | POA: Diagnosis not present

## 2018-12-05 DIAGNOSIS — H25013 Cortical age-related cataract, bilateral: Secondary | ICD-10-CM | POA: Diagnosis not present

## 2018-12-05 DIAGNOSIS — E113293 Type 2 diabetes mellitus with mild nonproliferative diabetic retinopathy without macular edema, bilateral: Secondary | ICD-10-CM | POA: Diagnosis not present

## 2018-12-05 DIAGNOSIS — H5203 Hypermetropia, bilateral: Secondary | ICD-10-CM | POA: Diagnosis not present

## 2018-12-22 DIAGNOSIS — E118 Type 2 diabetes mellitus with unspecified complications: Secondary | ICD-10-CM | POA: Diagnosis not present

## 2018-12-22 DIAGNOSIS — E663 Overweight: Secondary | ICD-10-CM | POA: Diagnosis not present

## 2018-12-22 DIAGNOSIS — M159 Polyosteoarthritis, unspecified: Secondary | ICD-10-CM | POA: Diagnosis not present

## 2018-12-22 DIAGNOSIS — F3341 Major depressive disorder, recurrent, in partial remission: Secondary | ICD-10-CM | POA: Diagnosis not present

## 2018-12-22 DIAGNOSIS — F419 Anxiety disorder, unspecified: Secondary | ICD-10-CM | POA: Diagnosis not present

## 2018-12-22 DIAGNOSIS — Z6827 Body mass index (BMI) 27.0-27.9, adult: Secondary | ICD-10-CM | POA: Diagnosis not present

## 2018-12-22 DIAGNOSIS — N200 Calculus of kidney: Secondary | ICD-10-CM | POA: Diagnosis not present

## 2018-12-22 DIAGNOSIS — K219 Gastro-esophageal reflux disease without esophagitis: Secondary | ICD-10-CM | POA: Diagnosis not present

## 2018-12-22 DIAGNOSIS — E7800 Pure hypercholesterolemia, unspecified: Secondary | ICD-10-CM | POA: Diagnosis not present

## 2019-01-19 DIAGNOSIS — Z1331 Encounter for screening for depression: Secondary | ICD-10-CM | POA: Diagnosis not present

## 2019-01-19 DIAGNOSIS — Z125 Encounter for screening for malignant neoplasm of prostate: Secondary | ICD-10-CM | POA: Diagnosis not present

## 2019-01-19 DIAGNOSIS — F419 Anxiety disorder, unspecified: Secondary | ICD-10-CM | POA: Diagnosis not present

## 2019-01-19 DIAGNOSIS — E118 Type 2 diabetes mellitus with unspecified complications: Secondary | ICD-10-CM | POA: Diagnosis not present

## 2019-01-19 DIAGNOSIS — K219 Gastro-esophageal reflux disease without esophagitis: Secondary | ICD-10-CM | POA: Diagnosis not present

## 2019-01-19 DIAGNOSIS — E663 Overweight: Secondary | ICD-10-CM | POA: Diagnosis not present

## 2019-01-19 DIAGNOSIS — F3341 Major depressive disorder, recurrent, in partial remission: Secondary | ICD-10-CM | POA: Diagnosis not present

## 2019-01-19 DIAGNOSIS — Z6827 Body mass index (BMI) 27.0-27.9, adult: Secondary | ICD-10-CM | POA: Diagnosis not present

## 2019-01-19 DIAGNOSIS — E7800 Pure hypercholesterolemia, unspecified: Secondary | ICD-10-CM | POA: Diagnosis not present

## 2019-01-19 DIAGNOSIS — Z0000 Encounter for general adult medical examination without abnormal findings: Secondary | ICD-10-CM | POA: Diagnosis not present

## 2019-01-19 DIAGNOSIS — M159 Polyosteoarthritis, unspecified: Secondary | ICD-10-CM | POA: Diagnosis not present

## 2019-01-19 DIAGNOSIS — N200 Calculus of kidney: Secondary | ICD-10-CM | POA: Diagnosis not present

## 2019-01-19 DIAGNOSIS — E785 Hyperlipidemia, unspecified: Secondary | ICD-10-CM | POA: Diagnosis not present

## 2019-01-19 DIAGNOSIS — Z9181 History of falling: Secondary | ICD-10-CM | POA: Diagnosis not present

## 2019-02-21 DIAGNOSIS — Z23 Encounter for immunization: Secondary | ICD-10-CM | POA: Diagnosis not present

## 2019-02-21 DIAGNOSIS — N200 Calculus of kidney: Secondary | ICD-10-CM | POA: Diagnosis not present

## 2019-02-21 DIAGNOSIS — E663 Overweight: Secondary | ICD-10-CM | POA: Diagnosis not present

## 2019-02-21 DIAGNOSIS — E7800 Pure hypercholesterolemia, unspecified: Secondary | ICD-10-CM | POA: Diagnosis not present

## 2019-02-21 DIAGNOSIS — E118 Type 2 diabetes mellitus with unspecified complications: Secondary | ICD-10-CM | POA: Diagnosis not present

## 2019-02-21 DIAGNOSIS — F3341 Major depressive disorder, recurrent, in partial remission: Secondary | ICD-10-CM | POA: Diagnosis not present

## 2019-02-21 DIAGNOSIS — F419 Anxiety disorder, unspecified: Secondary | ICD-10-CM | POA: Diagnosis not present

## 2019-02-21 DIAGNOSIS — M159 Polyosteoarthritis, unspecified: Secondary | ICD-10-CM | POA: Diagnosis not present

## 2019-02-21 DIAGNOSIS — K219 Gastro-esophageal reflux disease without esophagitis: Secondary | ICD-10-CM | POA: Diagnosis not present

## 2019-05-25 DIAGNOSIS — F419 Anxiety disorder, unspecified: Secondary | ICD-10-CM | POA: Diagnosis not present

## 2019-05-25 DIAGNOSIS — E7800 Pure hypercholesterolemia, unspecified: Secondary | ICD-10-CM | POA: Diagnosis not present

## 2019-05-25 DIAGNOSIS — K219 Gastro-esophageal reflux disease without esophagitis: Secondary | ICD-10-CM | POA: Diagnosis not present

## 2019-05-25 DIAGNOSIS — F3341 Major depressive disorder, recurrent, in partial remission: Secondary | ICD-10-CM | POA: Diagnosis not present

## 2019-05-25 DIAGNOSIS — M159 Polyosteoarthritis, unspecified: Secondary | ICD-10-CM | POA: Diagnosis not present

## 2019-05-25 DIAGNOSIS — E118 Type 2 diabetes mellitus with unspecified complications: Secondary | ICD-10-CM | POA: Diagnosis not present

## 2019-05-25 DIAGNOSIS — N200 Calculus of kidney: Secondary | ICD-10-CM | POA: Diagnosis not present

## 2019-05-25 DIAGNOSIS — Z6827 Body mass index (BMI) 27.0-27.9, adult: Secondary | ICD-10-CM | POA: Diagnosis not present

## 2019-05-25 DIAGNOSIS — E663 Overweight: Secondary | ICD-10-CM | POA: Diagnosis not present

## 2019-08-23 DIAGNOSIS — K219 Gastro-esophageal reflux disease without esophagitis: Secondary | ICD-10-CM | POA: Diagnosis not present

## 2019-08-23 DIAGNOSIS — Z6827 Body mass index (BMI) 27.0-27.9, adult: Secondary | ICD-10-CM | POA: Diagnosis not present

## 2019-08-23 DIAGNOSIS — N200 Calculus of kidney: Secondary | ICD-10-CM | POA: Diagnosis not present

## 2019-08-23 DIAGNOSIS — F419 Anxiety disorder, unspecified: Secondary | ICD-10-CM | POA: Diagnosis not present

## 2019-08-23 DIAGNOSIS — F3341 Major depressive disorder, recurrent, in partial remission: Secondary | ICD-10-CM | POA: Diagnosis not present

## 2019-08-23 DIAGNOSIS — E663 Overweight: Secondary | ICD-10-CM | POA: Diagnosis not present

## 2019-08-23 DIAGNOSIS — E7800 Pure hypercholesterolemia, unspecified: Secondary | ICD-10-CM | POA: Diagnosis not present

## 2019-08-23 DIAGNOSIS — M159 Polyosteoarthritis, unspecified: Secondary | ICD-10-CM | POA: Diagnosis not present

## 2019-08-23 DIAGNOSIS — E118 Type 2 diabetes mellitus with unspecified complications: Secondary | ICD-10-CM | POA: Diagnosis not present

## 2019-11-26 DIAGNOSIS — N200 Calculus of kidney: Secondary | ICD-10-CM | POA: Diagnosis not present

## 2019-11-26 DIAGNOSIS — M159 Polyosteoarthritis, unspecified: Secondary | ICD-10-CM | POA: Diagnosis not present

## 2019-11-26 DIAGNOSIS — E7800 Pure hypercholesterolemia, unspecified: Secondary | ICD-10-CM | POA: Diagnosis not present

## 2019-11-26 DIAGNOSIS — E663 Overweight: Secondary | ICD-10-CM | POA: Diagnosis not present

## 2019-11-26 DIAGNOSIS — F419 Anxiety disorder, unspecified: Secondary | ICD-10-CM | POA: Diagnosis not present

## 2019-11-26 DIAGNOSIS — K219 Gastro-esophageal reflux disease without esophagitis: Secondary | ICD-10-CM | POA: Diagnosis not present

## 2019-11-26 DIAGNOSIS — E118 Type 2 diabetes mellitus with unspecified complications: Secondary | ICD-10-CM | POA: Diagnosis not present

## 2019-11-26 DIAGNOSIS — Z139 Encounter for screening, unspecified: Secondary | ICD-10-CM | POA: Diagnosis not present

## 2019-11-26 DIAGNOSIS — F3341 Major depressive disorder, recurrent, in partial remission: Secondary | ICD-10-CM | POA: Diagnosis not present

## 2020-01-31 DIAGNOSIS — H25013 Cortical age-related cataract, bilateral: Secondary | ICD-10-CM | POA: Diagnosis not present

## 2020-01-31 DIAGNOSIS — H5203 Hypermetropia, bilateral: Secondary | ICD-10-CM | POA: Diagnosis not present

## 2020-01-31 DIAGNOSIS — E113293 Type 2 diabetes mellitus with mild nonproliferative diabetic retinopathy without macular edema, bilateral: Secondary | ICD-10-CM | POA: Diagnosis not present

## 2020-02-26 DIAGNOSIS — K219 Gastro-esophageal reflux disease without esophagitis: Secondary | ICD-10-CM | POA: Diagnosis not present

## 2020-02-26 DIAGNOSIS — E7800 Pure hypercholesterolemia, unspecified: Secondary | ICD-10-CM | POA: Diagnosis not present

## 2020-02-26 DIAGNOSIS — Z9181 History of falling: Secondary | ICD-10-CM | POA: Diagnosis not present

## 2020-02-26 DIAGNOSIS — F3341 Major depressive disorder, recurrent, in partial remission: Secondary | ICD-10-CM | POA: Diagnosis not present

## 2020-02-26 DIAGNOSIS — Z139 Encounter for screening, unspecified: Secondary | ICD-10-CM | POA: Diagnosis not present

## 2020-02-26 DIAGNOSIS — M159 Polyosteoarthritis, unspecified: Secondary | ICD-10-CM | POA: Diagnosis not present

## 2020-02-26 DIAGNOSIS — F419 Anxiety disorder, unspecified: Secondary | ICD-10-CM | POA: Diagnosis not present

## 2020-02-26 DIAGNOSIS — Z1331 Encounter for screening for depression: Secondary | ICD-10-CM | POA: Diagnosis not present

## 2020-02-26 DIAGNOSIS — N200 Calculus of kidney: Secondary | ICD-10-CM | POA: Diagnosis not present

## 2020-02-26 DIAGNOSIS — E118 Type 2 diabetes mellitus with unspecified complications: Secondary | ICD-10-CM | POA: Diagnosis not present

## 2020-05-17 DIAGNOSIS — F3341 Major depressive disorder, recurrent, in partial remission: Secondary | ICD-10-CM | POA: Diagnosis not present

## 2020-05-17 DIAGNOSIS — E1169 Type 2 diabetes mellitus with other specified complication: Secondary | ICD-10-CM | POA: Diagnosis not present

## 2020-05-17 DIAGNOSIS — K219 Gastro-esophageal reflux disease without esophagitis: Secondary | ICD-10-CM | POA: Diagnosis not present

## 2020-05-17 DIAGNOSIS — E785 Hyperlipidemia, unspecified: Secondary | ICD-10-CM | POA: Diagnosis not present

## 2020-05-17 DIAGNOSIS — E7800 Pure hypercholesterolemia, unspecified: Secondary | ICD-10-CM | POA: Diagnosis not present

## 2020-05-17 DIAGNOSIS — I440 Atrioventricular block, first degree: Secondary | ICD-10-CM | POA: Diagnosis not present

## 2020-05-17 DIAGNOSIS — M159 Polyosteoarthritis, unspecified: Secondary | ICD-10-CM | POA: Diagnosis not present

## 2020-05-17 DIAGNOSIS — N200 Calculus of kidney: Secondary | ICD-10-CM | POA: Diagnosis not present

## 2020-05-17 DIAGNOSIS — F419 Anxiety disorder, unspecified: Secondary | ICD-10-CM | POA: Diagnosis not present

## 2020-05-20 ENCOUNTER — Encounter: Payer: Self-pay | Admitting: Cardiology

## 2020-05-20 NOTE — Progress Notes (Signed)
Cardiology Office Note:    Date:  05/21/2020   ID:  William Tate, DOB 01-Dec-1950, MRN [id number]  PCP:  Simone Curia, MD  Cardiologist:  Norman Herrlich, MD   Referring MD: No ref. provider found  ASSESSMENT:    1. Shortness of breath   2. First degree AV block   3. Hyperlipidemia, unspecified hyperlipidemia type   4. Type 2 diabetes mellitus with diabetic polyneuropathy, unspecified whether long term insulin use (HCC)   5. Wenckebach second degree AV block    PLAN:    In order of problems listed above:  1. This is a very complex evaluation in a man with disproportionate shortness of breath to physical findings no evidence of heart failure no history of lung disease and findings of winky block second-degree AV block.  I am unsure whether the symptoms are predominantly due to bradycardia worsen heart block with activity or underlying heart disease he will undergo quick evaluation with a Zio live monitor and an echocardiogram.  Afterwards make a decision about whether he needs an ischemia evaluation with a coronary CTA.  At this time there is no indication but potentially this is anginal equivalent. 2. Stable continue with statin 3. Stable continue metformin he is not having hypoglycemia  Next appointment 4 weeks   Medication Adjustments/Labs and Tests Ordered: Current medicines are reviewed at length with the patient today.  Concerns regarding medicines are outlined above.  Orders Placed This Encounter  Procedures  . LONG TERM MONITOR-LIVE TELEMETRY (3-14 DAYS)  . EKG 12-Lead   No orders of the defined types were placed in this encounter.    Chief Complaint  Patient presents with  . Bradycardia    History of Present Illness:    William Tate is a 70 y.o. male who is being seen today for the evaluation of abnormal EKG at the request of No ref. provider found.  There is an EKG that was sent to me from [facility] 11/26/2014 showing sinus bradycardia first-degree AV  block and nonspecific T waves.  There is a notation that it was repeated at the office visit with PCP which was performed 05/17/2020 with first-degree AV block although unfortunately the EKG was not sent to my office.  Other medical problems include type 2 diabetes with hyperlipidemia GERD renal calculus anxiety depression.  His wife is present participates in evaluation decision making. For at least 3 months he has had increasing shortness of breath he is now short of breath walking a short distance outdoors inside the home and with ADLs.  He has no edema orthopnea and no chest pain.  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  He has had no cough or wheeze. His wife has been checking his pulse when she notices its erratic he gets heart rates with a pulse oximeter of 34-117 and often has resting heart rates in the range of 40 to 41 bpm.  Associated with this he has had a generalized decline he has had periods of confusion weakness fatigue and a dull headache.  He has no known history of heart disease or heart murmur.  He has not lost consciousness.  Past Medical History:  Diagnosis Date  . GERD (gastroesophageal reflux disease)   . Hyperlipidemia   . Type 2 diabetes mellitus (HCC)     Past Surgical History:  Procedure Laterality Date  . REPLACEMENT TOTAL KNEE Left     Current Medications: Current Meds  Medication Sig  . aspirin 81 MG EC tablet Take 81 mg by mouth daily. Swallow whole.  . citalopram (CELEXA) 10 MG tablet Take 10 mg by mouth daily.  Marland Kitchen glimepiride (AMARYL) 4 MG tablet Take 4 mg by mouth daily.  Marland Kitchen ibuprofen (ADVIL) 800 MG tablet Take 800 mg by mouth every 8 (eight) hours as needed.  . metFORMIN (GLUCOPHAGE) 850 MG tablet Take 850 mg by mouth 3 (three) times daily.  Marland Kitchen omeprazole (PRILOSEC) 20 MG capsule Take 20 mg by mouth daily.     Allergies:   Patient has no known allergies.   Social History   Socioeconomic History   . Marital status: Married    Spouse name: Not on file  . Number of children: Not on file  . Years of education: Not on file  . Highest education level: Not on file  Occupational History  . Not on file  Tobacco Use  . Smoking status: Former Games developer  . Smokeless tobacco: Never Used  Vaping Use  . Vaping Use: Never used  Substance and Sexual Activity  . Alcohol use: Never  . Drug use: Never  . Sexual activity: Not on file  Other Topics Concern  . Not on file  Social History Narrative  . Not on file   Social Determinants of Health   Financial Resource Strain: Not on file  Food Insecurity: Not on file  Transportation Needs: Not on file  Physical Activity: Not on file  Stress: Not on file  Social Connections: Not on file     Family History: The patient's family history includes Diabetes in his brother and maternal grandmother; Heart Problems in his sister; Heart disease in his brother; Hypertension in his brother, father, and mother; Stroke in his mother.  ROS:   ROS Please see the history of present illness.     All other systems reviewed and are negative.  EKGs/Labs/Other Studies Reviewed:    The following studies were reviewed today:   EKG:  EKG is  ordered today.  The ekg ordered today is personally reviewed and demonstrates baseline rhythm is sinus he has first-degree AV block PR interval proximal and he has have intermittent dropped beats either APCs that are blocked or more likely winky block.  There is no bundle branch block  Recent Labs: 02/26/2020 cholesterol 183 LDL 113 triglycerides 62 HDL 58 A1c 6.1%  Physical Exam:    VS:  BP (!) 154/74   Pulse (!) 54   Ht 6\' 2"  (1.88 m)   Wt 206 lb 12.8 oz (93.8 kg)   SpO2 98%   BMI 26.55 kg/m     Wt Readings from Last 3 Encounters:  05/21/20 206 lb 12.8 oz (93.8 kg)     GEN:  Well nourished, well developed in no acute distress HEENT: Normal NECK: No JVD; No carotid bruits LYMPHATICS: No  lymphadenopathy CARDIAC: RRR, no murmurs, rubs, gallops RESPIRATORY:  Clear to auscultation without rales, wheezing or rhonchi  ABDOMEN: Soft, non-tender, non-distended MUSCULOSKELETAL:  No edema; No deformity  SKIN: Warm and dry NEUROLOGIC:  Alert and oriented x 3 PSYCHIATRIC:  Normal affect     Signed, 05/23/20, MD  05/21/2020 3:10 PM    [facility]

## 2020-05-21 ENCOUNTER — Other Ambulatory Visit: Payer: Self-pay

## 2020-05-21 ENCOUNTER — Ambulatory Visit: Payer: Medicare HMO | Admitting: Cardiology

## 2020-05-21 ENCOUNTER — Ambulatory Visit (INDEPENDENT_AMBULATORY_CARE_PROVIDER_SITE_OTHER): Payer: Medicare HMO

## 2020-05-21 VITALS — BP 154/74 | HR 54 | Ht 74.0 in | Wt 206.8 lb

## 2020-05-21 DIAGNOSIS — E1142 Type 2 diabetes mellitus with diabetic polyneuropathy: Secondary | ICD-10-CM

## 2020-05-21 DIAGNOSIS — I440 Atrioventricular block, first degree: Secondary | ICD-10-CM

## 2020-05-21 DIAGNOSIS — R0602 Shortness of breath: Secondary | ICD-10-CM | POA: Diagnosis not present

## 2020-05-21 DIAGNOSIS — I441 Atrioventricular block, second degree: Secondary | ICD-10-CM

## 2020-05-21 DIAGNOSIS — E785 Hyperlipidemia, unspecified: Secondary | ICD-10-CM

## 2020-05-21 DIAGNOSIS — E119 Type 2 diabetes mellitus without complications: Secondary | ICD-10-CM | POA: Insufficient documentation

## 2020-05-21 DIAGNOSIS — K219 Gastro-esophageal reflux disease without esophagitis: Secondary | ICD-10-CM | POA: Insufficient documentation

## 2020-05-21 NOTE — Patient Instructions (Signed)
Medication Instructions:  Your physician recommends that you continue on your current medications as directed. Please refer to the Current Medication list given to you today.  *If you need a refill on your cardiac medications before your next appointment, please call your pharmacy*   Lab Work: None If you have labs (blood work) drawn today and your tests are completely normal, you will receive your results only by: Marland Kitchen MyChart Message (if you have MyChart) OR . A paper copy in the mail If you have any lab test that is abnormal or we need to change your treatment, we will call you to review the results.   Testing/Procedures: Your physician has requested that you have an echocardiogram. Echocardiography is a painless test that uses sound waves to create images of your heart. It provides your doctor with information about the size and shape of your heart and how well your heart's chambers and valves are working. This procedure takes approximately one hour. There are no restrictions for this procedure.  A zio monitor was ordered today. It will remain on for 14 days. You will then return monitor and event diary in provided box. It takes 1-2 weeks for report to be downloaded and returned to [place]. We will call you with the results. If monitor falls off or has orange flashing light, please call Zio for further instructions.      Follow-Up: At [facility], you and your health needs are our priority.  As part of our continuing mission to provide you with exceptional heart care, we have created designated Provider Care Teams.  These Care Teams include your primary Cardiologist (physician) and Advanced Practice Providers (APPs -  Physician Assistants and Nurse Practitioners) who all work together to provide you with the care you need, when you need it.  We recommend signing up for the patient portal called "MyChart".  Sign up information is provided on this After Visit Summary.  MyChart is used to  connect with patients for Virtual Visits (Telemedicine).  Patients are able to view lab/test results, encounter notes, upcoming appointments, etc.  Non-urgent messages can be sent to your provider as well.   To learn more about what you can do with MyChart, go to [email or web address].    Your next appointment:   4 week(s)  The format for your next appointment:   In Person  Provider:   Norman Herrlich, MD   Other Instructions

## 2020-05-22 ENCOUNTER — Telehealth: Payer: Self-pay | Admitting: Cardiology

## 2020-05-22 DIAGNOSIS — I440 Atrioventricular block, first degree: Secondary | ICD-10-CM | POA: Diagnosis not present

## 2020-05-22 NOTE — Telephone Encounter (Signed)
New message:     Patient wife calling stating that her and the patient has some questions concering patient ZO patch

## 2020-05-22 NOTE — Telephone Encounter (Signed)
Spoke to the patient just now and he was wondering if the ZIO company would have issues picking up on his monitor if they do not have cell phone service at their house. I let them know that they should be fine with or without cell phone service. They verbalize understanding and thank me for calling back.

## 2020-05-26 ENCOUNTER — Telehealth: Payer: Self-pay

## 2020-05-26 DIAGNOSIS — R0602 Shortness of breath: Secondary | ICD-10-CM | POA: Diagnosis not present

## 2020-05-26 DIAGNOSIS — I361 Nonrheumatic tricuspid (valve) insufficiency: Secondary | ICD-10-CM | POA: Diagnosis not present

## 2020-05-26 NOTE — Telephone Encounter (Signed)
From [facility] done today I reviewed the echocardiogram   He does not appear to have congestive heart failure.   Spoke with patient regarding results and recommendation.  Patient verbalizes understanding and is agreeable to plan of care. Advised patient to call back with any issues or concerns.

## 2020-05-28 ENCOUNTER — Telehealth: Payer: Self-pay | Admitting: Cardiology

## 2020-05-28 DIAGNOSIS — I440 Atrioventricular block, first degree: Secondary | ICD-10-CM | POA: Diagnosis not present

## 2020-05-28 DIAGNOSIS — E7800 Pure hypercholesterolemia, unspecified: Secondary | ICD-10-CM | POA: Diagnosis not present

## 2020-05-28 DIAGNOSIS — F419 Anxiety disorder, unspecified: Secondary | ICD-10-CM | POA: Diagnosis not present

## 2020-05-28 DIAGNOSIS — E785 Hyperlipidemia, unspecified: Secondary | ICD-10-CM | POA: Diagnosis not present

## 2020-05-28 DIAGNOSIS — N200 Calculus of kidney: Secondary | ICD-10-CM | POA: Diagnosis not present

## 2020-05-28 DIAGNOSIS — M159 Polyosteoarthritis, unspecified: Secondary | ICD-10-CM | POA: Diagnosis not present

## 2020-05-28 DIAGNOSIS — F3341 Major depressive disorder, recurrent, in partial remission: Secondary | ICD-10-CM | POA: Diagnosis not present

## 2020-05-28 DIAGNOSIS — K219 Gastro-esophageal reflux disease without esophagitis: Secondary | ICD-10-CM | POA: Diagnosis not present

## 2020-05-28 DIAGNOSIS — E1169 Type 2 diabetes mellitus with other specified complication: Secondary | ICD-10-CM | POA: Diagnosis not present

## 2020-05-28 NOTE — Telephone Encounter (Signed)
  Peer to peer request regarding cardiac monitor. Please call Dr Luisa Hart back within 24-48 hours.

## 2020-05-28 NOTE — Telephone Encounter (Signed)
Dr. Althea Charon was a very professional nice person and approved the monitor

## 2020-05-29 ENCOUNTER — Telehealth: Payer: Self-pay | Admitting: Cardiology

## 2020-05-29 NOTE — Telephone Encounter (Signed)
William Tate was calling stating Laster is currently having a lot of heart related symptoms as well as CP and is wanting to know what hospital Dr. Dulce Sellar would advise him to go to. I attempted to reach out to the nurse via Dr. Hulen Shouts ext as well as the DOD line to advise, but was unable to get an answer. When I came back on the line and advised William Tate of this I told her I can send a message to have them call her back and she stated " No, we'll just handle it" then hung up. Please advise.

## 2020-05-29 NOTE — Telephone Encounter (Signed)
Spoke to the patients wife just now and let her know that we generally recommend [facility] [address] in [place].   They verbalize understanding and thank me for the call back.

## 2020-05-30 ENCOUNTER — Ambulatory Visit: Payer: Medicare HMO | Admitting: Cardiology

## 2020-05-30 ENCOUNTER — Encounter: Payer: Self-pay | Admitting: Cardiology

## 2020-05-30 ENCOUNTER — Other Ambulatory Visit: Payer: Self-pay

## 2020-05-30 VITALS — BP 150/92 | HR 74 | Ht 74.0 in | Wt 205.0 lb

## 2020-05-30 DIAGNOSIS — R0609 Other forms of dyspnea: Secondary | ICD-10-CM

## 2020-05-30 DIAGNOSIS — I10 Essential (primary) hypertension: Secondary | ICD-10-CM

## 2020-05-30 DIAGNOSIS — R0600 Dyspnea, unspecified: Secondary | ICD-10-CM

## 2020-05-30 DIAGNOSIS — R072 Precordial pain: Secondary | ICD-10-CM | POA: Diagnosis not present

## 2020-05-30 DIAGNOSIS — E782 Mixed hyperlipidemia: Secondary | ICD-10-CM

## 2020-05-30 MED ORDER — FUROSEMIDE 20 MG PO TABS: 20.0000 mg | ORAL_TABLET | Freq: Every day | ORAL | 0 refills | Status: DC

## 2020-05-30 MED ORDER — POTASSIUM CHLORIDE ER 10 MEQ PO TBCR: 10.0000 meq | EXTENDED_RELEASE_TABLET | Freq: Every day | ORAL | 0 refills | Status: DC

## 2020-05-30 MED ORDER — NITROGLYCERIN 0.4 MG SL SUBL: 0.4000 mg | SUBLINGUAL_TABLET | SUBLINGUAL | 3 refills | Status: DC | PRN

## 2020-05-30 NOTE — Patient Instructions (Addendum)
Medication Instructions:  Your physician has recommended you make the following change in your medication:  START: Furosemide 20 mg take one tablet by mouth daily.  START: Potassium 10 meq take one tablet by mouth daily.  *If you need a refill on your cardiac medications before your next appointment, please call your pharmacy*   Lab Work: Your physician recommends that you return for lab work in: TODAY CBC, BMP If you have labs (blood work) drawn today and your tests are completely normal, you will receive your results only by: Marland Kitchen MyChart Message (if you have MyChart) OR . A paper copy in the mail If you have any lab test that is abnormal or we need to change your treatment, we will call you to review the results.   Testing/Procedures:    [facility]OVASC[facility] AT [place] [address] [place] [address] Dept: [phone] Loc: [phone]  KADEEN SROKA  05/30/2020  You are scheduled for a Cardiac Catheterization on Tuesday, January 1 with Dr. Bryan Lemma.  1. Please arrive at the [facility] (Main Entrance A) at [facility]: [address] [place], [place] [address] at 8:00 AM (This time is two hours before your procedure to ensure your preparation). Free valet parking service is available.   Special note: Every effort is made to have your procedure done on time. Please understand that emergencies sometimes delay scheduled procedures.  2. Diet: Do not eat solid foods after midnight.  The patient may have clear liquids until 5am upon the day of the procedure.  3. Labs: You will need to have blood drawn on YOU HAD YOUR LABS DRAWN TODAY 05/30/2020  4. Medication instructions in preparation for your procedure:   Contrast Allergy: No  Do not take Diabetes Med Glucophage (Metformin) on the day of the procedure and HOLD 48 HOURS AFTER THE PROCEDURE.   Hold your Amaryl the morning of the test.   On the  morning of your procedure, take your Aspirin and any morning medicines NOT listed above.  You may use sips of water.  5. Plan for one night stay--bring personal belongings. 6. Bring a current list of your medications and current insurance cards. 7. You MUST have a responsible person to drive you home. 8. Someone MUST be with you the first 24 hours after you arrive home or your discharge will be delayed. 9. Please wear clothes that are easy to get on and off and wear slip-on shoes.  Thank you for allowing [place] to care for you!   -- [facility]    Follow-Up: At [facility], you and your health needs are our priority.  As part of our continuing mission to provide you with exceptional heart care, we have created designated Provider Care Teams.  These Care Teams include your primary Cardiologist (physician) and Advanced Practice Providers (APPs -  Physician Assistants and Nurse Practitioners) who all work together to provide you with the care you need, when you need it.  We recommend signing up for the patient portal called "MyChart".  Sign up information is provided on this After Visit Summary.  MyChart is used to connect with patients for Virtual Visits (Telemedicine).  Patients are able to view lab/test results, encounter notes, upcoming appointments, etc.  Non-urgent messages can be sent to your provider as well.   To learn more about what you can do with MyChart, go to [email or web address].    Your next appointment:  2 week(s) after cath  The format for your next appointment:   In Person  Provider:   Thomasene Ripple, DO   Other Instructions

## 2020-05-30 NOTE — H&P (View-Only) (Signed)
Cardiology Office Note:    Date:  05/30/2020   ID:  William Ginsdward C Schmeling, DOB 01/08/1951, MRN [id number]  PCP:  Simone CuriaLee, Keung, MD  Cardiologist:  No primary care provider on file.  Electrophysiologist:  None   Referring MD: Simone CuriaLee, Keung, MD   " I am doing well"  History of Present Illness:    William Tate is a 70 y.o. male with a hx of diabetes mellitus, hyperlipidemia, hypertension, GERD, anxiety, family history of premature coronary artery disease, former smoker is here today for follow-up visit.  The patient was seen at by Dr. Dulce SellarMunley on May 04, 2019 at that time he has some shortness of breath with secondary A. fib block type I at that time a monitor was placed on the patient.  He is here today for follow-up visit.  He tells me that he has had worsening shortness of breath as well as intermittent chest pain.  He says this chest pain now is more than the shortness of breath is getting midsternal pressure-like diffuse sensation which one comes on he gets short of breath that he has to sit down before doing anything.  He noticed that over the last 2 days it has been not only a pressure-like sensation but a tightness.  The patient is concerned because his brother had an MI at a young age.  He has had some lightheadedness but nothing although the ordinary as he tells me.  Symptoms that brought him back to the office is the worsening shortness of breath as well as the chest pain.  His wife is with him today.  Past Medical History:  Diagnosis Date  . GERD (gastroesophageal reflux disease)   . Hyperlipidemia   . Type 2 diabetes mellitus (HCC)     Past Surgical History:  Procedure Laterality Date  . REPLACEMENT TOTAL KNEE Left     Current Medications: Current Meds  Medication Sig  . aspirin 81 MG EC tablet Take 81 mg by mouth daily. Swallow whole.  . citalopram (CELEXA) 10 MG tablet Take 10 mg by mouth daily.  . furosemide (LASIX) 20 MG tablet Take 1 tablet (20 mg total) by mouth daily.  Marland Kitchen.  glimepiride (AMARYL) 4 MG tablet Take 4 mg by mouth daily.  Marland Kitchen. ibuprofen (ADVIL) 800 MG tablet Take 800 mg by mouth every 8 (eight) hours as needed.  . metFORMIN (GLUCOPHAGE) 850 MG tablet Take 850 mg by mouth 3 (three) times daily.  . nitroGLYCERIN (NITROSTAT) 0.4 MG SL tablet Place 1 tablet (0.4 mg total) under the tongue every 5 (five) minutes as needed for chest pain.  Marland Kitchen. omeprazole (PRILOSEC) 20 MG capsule Take 20 mg by mouth daily.  . potassium chloride (KLOR-CON) 10 MEQ tablet Take 1 tablet (10 mEq total) by mouth daily.     Allergies:   Patient has no known allergies.   Social History   Socioeconomic History  . Marital status: Married    Spouse name: Not on file  . Number of children: Not on file  . Years of education: Not on file  . Highest education level: Not on file  Occupational History  . Not on file  Tobacco Use  . Smoking status: Former Games developermoker  . Smokeless tobacco: Never Used  Vaping Use  . Vaping Use: Never used  Substance and Sexual Activity  . Alcohol use: Never  . Drug use: Never  . Sexual activity: Not on file  Other Topics Concern  . Not on file  Social History Narrative  .  Not on file   Social Determinants of Health   Financial Resource Strain: Not on file  Food Insecurity: Not on file  Transportation Needs: Not on file  Physical Activity: Not on file  Stress: Not on file  Social Connections: Not on file     Family History: The patient's family history includes Diabetes in his brother and maternal grandmother; Heart Problems in his sister; Heart disease in his brother; Hypertension in his brother, father, and mother; Stroke in his mother.  ROS:   Review of Systems  Constitution: Negative for decreased appetite, fever and weight gain.  HENT: Negative for congestion, ear discharge, hoarse voice and sore throat.   Eyes: Negative for discharge, redness, vision loss in right eye and visual halos.  Cardiovascular: Reports chest pain, dyspnea on  exertion.  Negative for leg swelling, orthopnea and palpitations.  Respiratory: Negative for cough, hemoptysis, shortness of breath and snoring.   Endocrine: Negative for heat intolerance and polyphagia.  Hematologic/Lymphatic: Negative for bleeding problem. Does not bruise/bleed easily.  Skin: Negative for flushing, nail changes, rash and suspicious lesions.  Musculoskeletal: Negative for arthritis, joint pain, muscle cramps, myalgias, neck pain and stiffness.  Gastrointestinal: Negative for abdominal pain, bowel incontinence, diarrhea and excessive appetite.  Genitourinary: Negative for decreased libido, genital sores and incomplete emptying.  Neurological: Negative for brief paralysis, focal weakness, headaches and loss of balance.  Psychiatric/Behavioral: Negative for altered mental status, depression and suicidal ideas.  Allergic/Immunologic: Negative for HIV exposure and persistent infections.    EKGs/Labs/Other Studies Reviewed:    The following studies were reviewed today:   EKG:  The ekg ordered today demonstrates    He had an echocardiogram which was done on May 27, 2019 at [facility] which showed normal ejection fraction of 55 to 60%.  Diastolic function was not determined.  Right ventricle is normal size, left atrium was normal in size.  Right atrium is normal size and function.  No valvular abnormalities were noted.  Recent Labs: No results found for requested labs within last 8760 hours.  Recent Lipid Panel No results found for: CHOL, TRIG, HDL, CHOLHDL, VLDL, LDLCALC, LDLDIRECT  Physical Exam:    VS:  BP (!) 150/92   Pulse 74   Ht 6\' 2"  (1.88 m)   Wt 205 lb (93 kg)   SpO2 98%   BMI 26.32 kg/m     Wt Readings from Last 3 Encounters:  05/30/20 205 lb (93 kg)  05/21/20 206 lb 12.8 oz (93.8 kg)     GEN: Well nourished, well developed in no acute distress HEENT: Normal NECK: Mild JVD; No carotid bruits LYMPHATICS: No lymphadenopathy CARDIAC: S1S2  noted,RRR, no murmurs, rubs, gallops RESPIRATORY:  Clear to auscultation without rales, wheezing or rhonchi  ABDOMEN: Soft, non-tender, non-distended, +bowel sounds, no guarding. EXTREMITIES: No edema, No cyanosis, no clubbing MUSCULOSKELETAL:  No deformity  SKIN: Warm and dry NEUROLOGIC:  Alert and oriented x 3, non-focal PSYCHIATRIC:  Normal affect, good insight  ASSESSMENT:    1. Precordial pain   2. Dyspnea on exertion   3. Mixed hyperlipidemia   4. Primary hypertension    PLAN:    1.His blood pressure is elevated today and he also does have mild JVD what I like to do is give the patient cautious diuretics for the next 4 days and see if this is going to help his symptoms.  He will take Lasix 20 mg daily with potassium supplement.  2.  In addition his symptoms is worsening  and he has had high risk for coronary artery disease diabetes, hyperlipidemia, and family history that is premature.  I think at this point a right and left heart catheterization would be very appropriate in this patient.  I talked with the patient about this testing, the patient understands that risks include but are not limited to stroke (1 in 1000), death (1 in 1000), kidney failure [usually temporary] (1 in 500), bleeding (1 in 200), allergic reaction [possibly serious] (1 in 200), and agrees to proceed.  Continue patient on his aspirin, he tells me he just has been started on Lipitor 10 mg 2 days ago so we will keep that for now and if coronary artery disease we will have to increase this.  He still wearing his ZIO live monitor giving his type I second-degree AV block.  Disruption in his electrical conduction could be causing his shortness of breath as well.  But ruling out coronary artery disease will really help [place] to help this patient to understand and help clinically.  3.  This is being managed by his primary care doctor.  No adjustments for antidiabetic medications were made today.  4.  Lipid profile which  was done on May 29, 2019 showed HDL 65, LDL 121, total cholesterol 200, triglycerides 75 this is when his lipid lowering medication was started.    The patient is in agreement with the above plan. The patient left the office in stable condition.  The patient will follow up in 2 weeks post left heart catheterization.   Medication Adjustments/Labs and Tests Ordered: Current medicines are reviewed at length with the patient today.  Concerns regarding medicines are outlined above.  Orders Placed This Encounter  Procedures  . Basic metabolic panel  . CBC  . Magnesium  . VITAMIN D 25 Hydroxy (Vit-D Deficiency, Fractures)   Meds ordered this encounter  Medications  . furosemide (LASIX) 20 MG tablet    Sig: Take 1 tablet (20 mg total) by mouth daily.    Dispense:  4 tablet    Refill:  0  . potassium chloride (KLOR-CON) 10 MEQ tablet    Sig: Take 1 tablet (10 mEq total) by mouth daily.    Dispense:  4 tablet    Refill:  0  . nitroGLYCERIN (NITROSTAT) 0.4 MG SL tablet    Sig: Place 1 tablet (0.4 mg total) under the tongue every 5 (five) minutes as needed for chest pain.    Dispense:  90 tablet    Refill:  3    Patient Instructions  Medication Instructions:  Your physician has recommended you make the following change in your medication:  START: Furosemide 20 mg take one tablet by mouth daily.  START: Potassium 10 meq take one tablet by mouth daily.  *If you need a refill on your cardiac medications before your next appointment, please call your pharmacy*   Lab Work: Your physician recommends that you return for lab work in: TODAY CBC, BMP If you have labs (blood work) drawn today and your tests are completely normal, you will receive your results only by: Marland Kitchen MyChart Message (if you have MyChart) OR . A paper copy in the mail If you have any lab test that is abnormal or we need to change your treatment, we will call you to review the results.   Testing/Procedures:    [facility]  [facility] CARDIOVASCULAR DIVISION [facility] [place] [address] [place] [address] Dept: [phone] Loc: [phone]  Ramon Dredge  C Amir  05/30/2020  You are scheduled for a Cardiac Catheterization on Tuesday, January 1 with Dr. Bryan Lemma.  1. Please arrive at the [facility] (Main Entrance A) at [facility]: [address] [place], [place] [address] at 8:00 AM (This time is two hours before your procedure to ensure your preparation). Free valet parking service is available.   Special note: Every effort is made to have your procedure done on time. Please understand that emergencies sometimes delay scheduled procedures.  2. Diet: Do not eat solid foods after midnight.  The patient may have clear liquids until 5am upon the day of the procedure.  3. Labs: You will need to have blood drawn on YOU HAD YOUR LABS DRAWN TODAY 05/30/2020  4. Medication instructions in preparation for your procedure:   Contrast Allergy: No  Do not take Diabetes Med Glucophage (Metformin) on the day of the procedure and HOLD 48 HOURS AFTER THE PROCEDURE.   Hold your Amaryl the morning of the test.   On the morning of your procedure, take your Aspirin and any morning medicines NOT listed above.  You may use sips of water.  5. Plan for one night stay--bring personal belongings. 6. Bring a current list of your medications and current insurance cards. 7. You MUST have a responsible person to drive you home. 8. Someone MUST be with you the first 24 hours after you arrive home or your discharge will be delayed. 9. Please wear clothes that are easy to get on and off and wear slip-on shoes.  Thank you for allowing [place] to care for you!   -- [facility]    Follow-Up: At [facility], you and your health needs are our priority.  As part of our continuing mission to provide you with exceptional heart care, we have created designated  Provider Care Teams.  These Care Teams include your primary Cardiologist (physician) and Advanced Practice Providers (APPs -  Physician Assistants and Nurse Practitioners) who all work together to provide you with the care you need, when you need it.  We recommend signing up for the patient portal called "MyChart".  Sign up information is provided on this After Visit Summary.  MyChart is used to connect with patients for Virtual Visits (Telemedicine).  Patients are able to view lab/test results, encounter notes, upcoming appointments, etc.  Non-urgent messages can be sent to your provider as well.   To learn more about what you can do with MyChart, go to [email or web address].    Your next appointment:   2 week(s) after cath  The format for your next appointment:   In Person  Provider:   Thomasene Ripple, DO   Other Instructions      Adopting a Healthy Lifestyle.  Know what a healthy weight is for you (roughly BMI <25) and aim to maintain this   Aim for 7+ servings of fruits and vegetables daily   65-80+ fluid ounces of water or unsweet tea for healthy kidneys   Limit to max 1 drink of alcohol per day; avoid smoking/tobacco   Limit animal fats in diet for cholesterol and heart health - choose grass fed whenever available   Avoid highly processed foods, and foods high in saturated/trans fats   Aim for low stress - take time to unwind and care for your mental health   Aim for 150 min of moderate intensity exercise weekly for heart health, and weights twice weekly for bone health   Aim  for 7-9 hours of sleep daily   When it comes to diets, agreement about the perfect plan isnt easy to find, even among the experts. Experts at the [facility] [employer] developed an idea known as the Healthy Eating Plate. Just imagine a plate divided into logical, healthy portions.   The emphasis is on diet quality:   Load up on vegetables and fruits - one-half of your plate: Aim for  color and variety, and remember that potatoes dont count.   Go for whole grains - one-quarter of your plate: Whole wheat, barley, wheat berries, quinoa, oats, brown rice, and foods made with them. If you want pasta, go with whole wheat pasta.   Protein power - one-quarter of your plate: Fish, chicken, beans, and nuts are all healthy, versatile protein sources. Limit red meat.   The diet, however, does go beyond the plate, offering a few other suggestions.   Use healthy plant oils, such as olive, canola, soy, corn, sunflower and peanut. Check the labels, and avoid partially hydrogenated oil, which have unhealthy trans fats.   If youre thirsty, drink water. Coffee and tea are good in moderation, but skip sugary drinks and limit milk and dairy products to one or two daily servings.   The type of carbohydrate in the diet is more important than the amount. Some sources of carbohydrates, such as vegetables, fruits, whole grains, and beans-are healthier than others.   Finally, stay active  Signed, Thomasene Ripple, DO  05/30/2020 2:58 PM    [facility]

## 2020-05-30 NOTE — Addendum Note (Signed)
Addended by: Delorse Limber I on: 05/30/2020 03:06 PM   Modules accepted: Orders

## 2020-05-30 NOTE — Progress Notes (Signed)
Cardiology Office Note:    Date:  05/30/2020   ID:  William Tate, DOB 07/19/1950, MRN [id number]  PCP:  Lee, Keung, MD  Cardiologist:  No primary care provider on file.  Electrophysiologist:  None   Referring MD: Lee, Keung, MD   " I am doing well"  History of Present Illness:    William Tate is a 69 y.o. male with a hx of diabetes mellitus, hyperlipidemia, hypertension, GERD, anxiety, family history of premature coronary artery disease, former smoker is here today for follow-up visit.  The patient was seen at by Dr. Munley on May 04, 2019 at that time he has some shortness of breath with secondary A. fib block type I at that time a monitor was placed on the patient.  He is here today for follow-up visit.  He tells me that he has had worsening shortness of breath as well as intermittent chest pain.  He says this chest pain now is more than the shortness of breath is getting midsternal pressure-like diffuse sensation which one comes on he gets short of breath that he has to sit down before doing anything.  He noticed that over the last 2 days it has been not only a pressure-like sensation but a tightness.  The patient is concerned because his brother had an MI at a young age.  He has had some lightheadedness but nothing although the ordinary as he tells me.  Symptoms that brought him back to the office is the worsening shortness of breath as well as the chest pain.  His wife is with him today.  Past Medical History:  Diagnosis Date  . GERD (gastroesophageal reflux disease)   . Hyperlipidemia   . Type 2 diabetes mellitus (HCC)     Past Surgical History:  Procedure Laterality Date  . REPLACEMENT TOTAL KNEE Left     Current Medications: Current Meds  Medication Sig  . aspirin 81 MG EC tablet Take 81 mg by mouth daily. Swallow whole.  . citalopram (CELEXA) 10 MG tablet Take 10 mg by mouth daily.  . furosemide (LASIX) 20 MG tablet Take 1 tablet (20 mg total) by mouth daily.  .  glimepiride (AMARYL) 4 MG tablet Take 4 mg by mouth daily.  . ibuprofen (ADVIL) 800 MG tablet Take 800 mg by mouth every 8 (eight) hours as needed.  . metFORMIN (GLUCOPHAGE) 850 MG tablet Take 850 mg by mouth 3 (three) times daily.  . nitroGLYCERIN (NITROSTAT) 0.4 MG SL tablet Place 1 tablet (0.4 mg total) under the tongue every 5 (five) minutes as needed for chest pain.  . omeprazole (PRILOSEC) 20 MG capsule Take 20 mg by mouth daily.  . potassium chloride (KLOR-CON) 10 MEQ tablet Take 1 tablet (10 mEq total) by mouth daily.     Allergies:   Patient has no known allergies.   Social History   Socioeconomic History  . Marital status: Married    Spouse name: Not on file  . Number of children: Not on file  . Years of education: Not on file  . Highest education level: Not on file  Occupational History  . Not on file  Tobacco Use  . Smoking status: Former Smoker  . Smokeless tobacco: Never Used  Vaping Use  . Vaping Use: Never used  Substance and Sexual Activity  . Alcohol use: Never  . Drug use: Never  . Sexual activity: Not on file  Other Topics Concern  . Not on file  Social History Narrative  .   Not on file   Social Determinants of Health   Financial Resource Strain: Not on file  Food Insecurity: Not on file  Transportation Needs: Not on file  Physical Activity: Not on file  Stress: Not on file  Social Connections: Not on file     Family History: The patient's family history includes Diabetes in his brother and maternal grandmother; Heart Problems in his sister; Heart disease in his brother; Hypertension in his brother, father, and mother; Stroke in his mother.  ROS:   Review of Systems  Constitution: Negative for decreased appetite, fever and weight gain.  HENT: Negative for congestion, ear discharge, hoarse voice and sore throat.   Eyes: Negative for discharge, redness, vision loss in right eye and visual halos.  Cardiovascular: Reports chest pain, dyspnea on  exertion.  Negative for leg swelling, orthopnea and palpitations.  Respiratory: Negative for cough, hemoptysis, shortness of breath and snoring.   Endocrine: Negative for heat intolerance and polyphagia.  Hematologic/Lymphatic: Negative for bleeding problem. Does not bruise/bleed easily.  Skin: Negative for flushing, nail changes, rash and suspicious lesions.  Musculoskeletal: Negative for arthritis, joint pain, muscle cramps, myalgias, neck pain and stiffness.  Gastrointestinal: Negative for abdominal pain, bowel incontinence, diarrhea and excessive appetite.  Genitourinary: Negative for decreased libido, genital sores and incomplete emptying.  Neurological: Negative for brief paralysis, focal weakness, headaches and loss of balance.  Psychiatric/Behavioral: Negative for altered mental status, depression and suicidal ideas.  Allergic/Immunologic: Negative for HIV exposure and persistent infections.    EKGs/Labs/Other Studies Reviewed:    The following studies were reviewed today:   EKG:  The ekg ordered today demonstrates    He had an echocardiogram which was done on May 27, 2019 at [facility] which showed normal ejection fraction of 55 to 60%.  Diastolic function was not determined.  Right ventricle is normal size, left atrium was normal in size.  Right atrium is normal size and function.  No valvular abnormalities were noted.  Recent Labs: No results found for requested labs within last 8760 hours.  Recent Lipid Panel No results found for: CHOL, TRIG, HDL, CHOLHDL, VLDL, LDLCALC, LDLDIRECT  Physical Exam:    VS:  BP (!) 150/92   Pulse 74   Ht 6\' 2"  (1.88 m)   Wt 205 lb (93 kg)   SpO2 98%   BMI 26.32 kg/m     Wt Readings from Last 3 Encounters:  05/30/20 205 lb (93 kg)  05/21/20 206 lb 12.8 oz (93.8 kg)     GEN: Well nourished, well developed in no acute distress HEENT: Normal NECK: Mild JVD; No carotid bruits LYMPHATICS: No lymphadenopathy CARDIAC: S1S2  noted,RRR, no murmurs, rubs, gallops RESPIRATORY:  Clear to auscultation without rales, wheezing or rhonchi  ABDOMEN: Soft, non-tender, non-distended, +bowel sounds, no guarding. EXTREMITIES: No edema, No cyanosis, no clubbing MUSCULOSKELETAL:  No deformity  SKIN: Warm and dry NEUROLOGIC:  Alert and oriented x 3, non-focal PSYCHIATRIC:  Normal affect, good insight  ASSESSMENT:    1. Precordial pain   2. Dyspnea on exertion   3. Mixed hyperlipidemia   4. Primary hypertension    PLAN:    1.His blood pressure is elevated today and he also does have mild JVD what I like to do is give the patient cautious diuretics for the next 4 days and see if this is going to help his symptoms.  He will take Lasix 20 mg daily with potassium supplement.  2.  In addition his symptoms is worsening  and he has had high risk for coronary artery disease diabetes, hyperlipidemia, and family history that is premature.  I think at this point a right and left heart catheterization would be very appropriate in this patient.  I talked with the patient about this testing, the patient understands that risks include but are not limited to stroke (1 in 1000), death (1 in 1000), kidney failure [usually temporary] (1 in 500), bleeding (1 in 200), allergic reaction [possibly serious] (1 in 200), and agrees to proceed.  Continue patient on his aspirin, he tells me he just has been started on Lipitor 10 mg 2 days ago so we will keep that for now and if coronary artery disease we will have to increase this.  He still wearing his [employer]IO live monitor giving his type I second-degree AV block.  Disruption in his electrical conduction could be causing his shortness of breath as well.  But ruling out coronary artery disease will really help [place] to help this patient to understand and help clinically.  3.  This is being managed by his primary care doctor.  No adjustments for antidiabetic medications were made today.  4.  Lipid profile which  was done on May 29, 2019 showed HDL 65, LDL 121, total cholesterol 200, triglycerides 75 this is when his lipid lowering medication was started.    The patient is in agreement with the above plan. The patient left the office in stable condition.  The patient will follow up in 2 weeks post left heart catheterization.   Medication Adjustments/Labs and Tests Ordered: Current medicines are reviewed at length with the patient today.  Concerns regarding medicines are outlined above.  Orders Placed This Encounter  Procedures  . Basic metabolic panel  . CBC  . Magnesium  . VITAMIN D 25 Hydroxy (Vit-D Deficiency, Fractures)   Meds ordered this encounter  Medications  . furosemide (LASIX) 20 MG tablet    Sig: Take 1 tablet (20 mg total) by mouth daily.    Dispense:  4 tablet    Refill:  0  . potassium chloride (KLOR-CON) 10 MEQ tablet    Sig: Take 1 tablet (10 mEq total) by mouth daily.    Dispense:  4 tablet    Refill:  0  . nitroGLYCERIN (NITROSTAT) 0.4 MG SL tablet    Sig: Place 1 tablet (0.4 mg total) under the tongue every 5 (five) minutes as needed for chest pain.    Dispense:  90 tablet    Refill:  3    Patient Instructions  Medication Instructions:  Your physician has recommended you make the following change in your medication:  START: Furosemide 20 mg take one tablet by mouth daily.  START: Potassium 10 meq take one tablet by mouth daily.  *If you need a refill on your cardiac medications before your next appointment, please call your pharmacy*   Lab Work: Your physician recommends that you return for lab work in: TODAY CBC, BMP If you have labs (blood work) drawn today and your tests are completely normal, you will receive your results only by: Marland Kitchen MyChart Message (if you have MyChart) OR . A paper copy in the mail If you have any lab test that is abnormal or we need to change your treatment, we will call you to review the results.   Testing/Procedures:    [facility]  [facility] CARDIOVASCULAR DIVISION [facility] AT [place] [address] [place] [address] Dept: [phone] Loc: [phone]  Ramon Dredge  C Amir  05/30/2020  You are scheduled for a Cardiac Catheterization on Tuesday, January 1 with Dr. Bryan Lemma.  1. Please arrive at the [facility] (Main Entrance A) at [facility]: [address] [place], [place] [address] at 8:00 AM (This time is two hours before your procedure to ensure your preparation). Free valet parking service is available.   Special note: Every effort is made to have your procedure done on time. Please understand that emergencies sometimes delay scheduled procedures.  2. Diet: Do not eat solid foods after midnight.  The patient may have clear liquids until 5am upon the day of the procedure.  3. Labs: You will need to have blood drawn on YOU HAD YOUR LABS DRAWN TODAY 05/30/2020  4. Medication instructions in preparation for your procedure:   Contrast Allergy: No  Do not take Diabetes Med Glucophage (Metformin) on the day of the procedure and HOLD 48 HOURS AFTER THE PROCEDURE.   Hold your Amaryl the morning of the test.   On the morning of your procedure, take your Aspirin and any morning medicines NOT listed above.  You may use sips of water.  5. Plan for one night stay--bring personal belongings. 6. Bring a current list of your medications and current insurance cards. 7. You MUST have a responsible person to drive you home. 8. Someone MUST be with you the first 24 hours after you arrive home or your discharge will be delayed. 9. Please wear clothes that are easy to get on and off and wear slip-on shoes.  Thank you for allowing [place] to care for you!   -- [facility]    Follow-Up: At [facility], you and your health needs are our priority.  As part of our continuing mission to provide you with exceptional heart care, we have created designated  Provider Care Teams.  These Care Teams include your primary Cardiologist (physician) and Advanced Practice Providers (APPs -  Physician Assistants and Nurse Practitioners) who all work together to provide you with the care you need, when you need it.  We recommend signing up for the patient portal called "MyChart".  Sign up information is provided on this After Visit Summary.  MyChart is used to connect with patients for Virtual Visits (Telemedicine).  Patients are able to view lab/test results, encounter notes, upcoming appointments, etc.  Non-urgent messages can be sent to your provider as well.   To learn more about what you can do with MyChart, go to [email or web address].    Your next appointment:   2 week(s) after cath  The format for your next appointment:   In Person  Provider:   Thomasene Ripple, DO   Other Instructions      Adopting a Healthy Lifestyle.  Know what a healthy weight is for you (roughly BMI <25) and aim to maintain this   Aim for 7+ servings of fruits and vegetables daily   65-80+ fluid ounces of water or unsweet tea for healthy kidneys   Limit to max 1 drink of alcohol per day; avoid smoking/tobacco   Limit animal fats in diet for cholesterol and heart health - choose grass fed whenever available   Avoid highly processed foods, and foods high in saturated/trans fats   Aim for low stress - take time to unwind and care for your mental health   Aim for 150 min of moderate intensity exercise weekly for heart health, and weights twice weekly for bone health   Aim  for 7-9 hours of sleep daily   When it comes to diets, agreement about the perfect plan isnt easy to find, even among the experts. Experts at the [facility] [employer] developed an idea known as the Healthy Eating Plate. Just imagine a plate divided into logical, healthy portions.   The emphasis is on diet quality:   Load up on vegetables and fruits - one-half of your plate: Aim for  color and variety, and remember that potatoes dont count.   Go for whole grains - one-quarter of your plate: Whole wheat, barley, wheat berries, quinoa, oats, brown rice, and foods made with them. If you want pasta, go with whole wheat pasta.   Protein power - one-quarter of your plate: Fish, chicken, beans, and nuts are all healthy, versatile protein sources. Limit red meat.   The diet, however, does go beyond the plate, offering a few other suggestions.   Use healthy plant oils, such as olive, canola, soy, corn, sunflower and peanut. Check the labels, and avoid partially hydrogenated oil, which have unhealthy trans fats.   If youre thirsty, drink water. Coffee and tea are good in moderation, but skip sugary drinks and limit milk and dairy products to one or two daily servings.   The type of carbohydrate in the diet is more important than the amount. Some sources of carbohydrates, such as vegetables, fruits, whole grains, and beans-are healthier than others.   Finally, stay active  Signed, Thomasene Ripple, DO  05/30/2020 2:58 PM    [facility]

## 2020-05-31 LAB — BASIC METABOLIC PANEL WITH GFR
BUN/Creatinine Ratio: 13 (ref 10–24)
BUN: 11 mg/dL (ref 8–27)
CO2: 24 mmol/L (ref 20–29)
Calcium: 10.1 mg/dL (ref 8.6–10.2)
Chloride: 103 mmol/L (ref 96–106)
Creatinine, Ser: 0.87 mg/dL (ref 0.76–1.27)
GFR calc Af Amer: 102 mL/min/{1.73_m2}
GFR calc non Af Amer: 88 mL/min/{1.73_m2}
Glucose: 159 mg/dL — ABNORMAL HIGH (ref 65–99)
Potassium: 4.2 mmol/L (ref 3.5–5.2)
Sodium: 142 mmol/L (ref 134–144)

## 2020-05-31 LAB — CBC
Hematocrit: 44.9 % (ref 37.5–51.0)
Hemoglobin: 15.5 g/dL (ref 13.0–17.7)
MCH: 33.1 pg — ABNORMAL HIGH (ref 26.6–33.0)
MCHC: 34.5 g/dL (ref 31.5–35.7)
MCV: 96 fL (ref 79–97)
Platelets: 235 10*3/uL (ref 150–450)
RBC: 4.68 x10E6/uL (ref 4.14–5.80)
RDW: 12.2 % (ref 11.6–15.4)
WBC: 5.1 10*3/uL (ref 3.4–10.8)

## 2020-05-31 LAB — VITAMIN D 25 HYDROXY (VIT D DEFICIENCY, FRACTURES): Vit D, 25-Hydroxy: 12.5 ng/mL — ABNORMAL LOW (ref 30.0–100.0)

## 2020-05-31 LAB — MAGNESIUM: Magnesium: 1.9 mg/dL (ref 1.6–2.3)

## 2020-06-02 ENCOUNTER — Other Ambulatory Visit (HOSPITAL_COMMUNITY)
Admission: RE | Admit: 2020-06-02 | Discharge: 2020-06-02 | Disposition: A | Discharge status: Home / Self Care | Payer: Medicare HMO | Source: Ambulatory Visit | Attending: Cardiology | Admitting: Cardiology

## 2020-06-02 ENCOUNTER — Telehealth: Payer: Self-pay | Admitting: *Deleted

## 2020-06-02 DIAGNOSIS — Z01812 Encounter for preprocedural laboratory examination: Secondary | ICD-10-CM | POA: Insufficient documentation

## 2020-06-02 DIAGNOSIS — I440 Atrioventricular block, first degree: Secondary | ICD-10-CM | POA: Diagnosis not present

## 2020-06-02 DIAGNOSIS — Z20822 Contact with and (suspected) exposure to covid-19: Secondary | ICD-10-CM | POA: Diagnosis not present

## 2020-06-02 LAB — SARS CORONAVIRUS 2 (TAT 6-24 HRS): SARS Coronavirus 2: NEGATIVE

## 2020-06-02 NOTE — Telephone Encounter (Signed)
Pt contacted pre-catheterization scheduled at [facility] for: Tuesday June 03, 2020 10 AM Verified arrival time and place: [facility] Main Entrance A [facility]) at: 8 AM   No solid food after midnight prior to cath, clear liquids until 5 AM day of procedure.  Hold: Metformin-day of procedure and 48 hours post procedure Glimepiride-AM of procedure  Except hold medications AM meds can be  taken pre-cath with sips of water including: ASA 81 mg   Confirmed patient has responsible adult to drive home post procedure and be with patient first 24 hours after arriving home: yes  You are allowed ONE visitor in the waiting room during the time you are at the hospital for your procedure. Both you and your visitor must wear a mask once you enter the hospital.   Reviewed procedure/mask/visitor instructions with patient.

## 2020-06-03 ENCOUNTER — Other Ambulatory Visit: Payer: Self-pay

## 2020-06-03 ENCOUNTER — Ambulatory Visit (HOSPITAL_COMMUNITY)
Admission: RE | Admit: 2020-06-03 | Discharge: 2020-06-03 | Disposition: A | Discharge status: Home / Self Care | Payer: Medicare HMO | Attending: Cardiology | Admitting: Cardiology

## 2020-06-03 ENCOUNTER — Encounter (HOSPITAL_COMMUNITY)
Admission: RE | Disposition: A | Discharge status: Home / Self Care | Payer: Self-pay | Source: Home / Self Care | Attending: Cardiology

## 2020-06-03 DIAGNOSIS — I208 Other forms of angina pectoris: Secondary | ICD-10-CM | POA: Diagnosis not present

## 2020-06-03 DIAGNOSIS — I25119 Atherosclerotic heart disease of native coronary artery with unspecified angina pectoris: Secondary | ICD-10-CM | POA: Insufficient documentation

## 2020-06-03 DIAGNOSIS — Z79899 Other long term (current) drug therapy: Secondary | ICD-10-CM | POA: Insufficient documentation

## 2020-06-03 DIAGNOSIS — Z87891 Personal history of nicotine dependence: Secondary | ICD-10-CM | POA: Insufficient documentation

## 2020-06-03 DIAGNOSIS — R0609 Other forms of dyspnea: Secondary | ICD-10-CM | POA: Insufficient documentation

## 2020-06-03 DIAGNOSIS — E782 Mixed hyperlipidemia: Secondary | ICD-10-CM | POA: Diagnosis not present

## 2020-06-03 DIAGNOSIS — R072 Precordial pain: Secondary | ICD-10-CM

## 2020-06-03 DIAGNOSIS — E119 Type 2 diabetes mellitus without complications: Secondary | ICD-10-CM | POA: Insufficient documentation

## 2020-06-03 DIAGNOSIS — R0600 Dyspnea, unspecified: Secondary | ICD-10-CM

## 2020-06-03 DIAGNOSIS — I441 Atrioventricular block, second degree: Secondary | ICD-10-CM | POA: Diagnosis present

## 2020-06-03 DIAGNOSIS — Z7984 Long term (current) use of oral hypoglycemic drugs: Secondary | ICD-10-CM | POA: Diagnosis not present

## 2020-06-03 DIAGNOSIS — I10 Essential (primary) hypertension: Secondary | ICD-10-CM | POA: Insufficient documentation

## 2020-06-03 DIAGNOSIS — Z7982 Long term (current) use of aspirin: Secondary | ICD-10-CM | POA: Diagnosis not present

## 2020-06-03 HISTORY — PX: RIGHT/LEFT HEART CATH AND CORONARY ANGIOGRAPHY: CATH118266

## 2020-06-03 LAB — POCT I-STAT EG7
Acid-Base Excess: 0 mmol/L (ref 0.0–2.0)
Acid-Base Excess: 0 mmol/L (ref 0.0–2.0)
Bicarbonate: 26.2 mmol/L (ref 20.0–28.0)
Bicarbonate: 26.2 mmol/L (ref 20.0–28.0)
Calcium, Ion: 1.24 mmol/L (ref 1.15–1.40)
Calcium, Ion: 1.24 mmol/L (ref 1.15–1.40)
HCT: 38 % — ABNORMAL LOW (ref 39.0–52.0)
HCT: 39 % (ref 39.0–52.0)
Hemoglobin: 12.9 g/dL — ABNORMAL LOW (ref 13.0–17.0)
Hemoglobin: 13.3 g/dL (ref 13.0–17.0)
O2 Saturation: 75 %
O2 Saturation: 76 %
Potassium: 3.8 mmol/L (ref 3.5–5.1)
Potassium: 3.9 mmol/L (ref 3.5–5.1)
Sodium: 140 mmol/L (ref 135–145)
Sodium: 141 mmol/L (ref 135–145)
TCO2: 28 mmol/L (ref 22–32)
TCO2: 28 mmol/L (ref 22–32)
pCO2, Ven: 47.4 mmHg (ref 44.0–60.0)
pCO2, Ven: 47.5 mmHg (ref 44.0–60.0)
pH, Ven: 7.35 (ref 7.250–7.430)
pH, Ven: 7.35 (ref 7.250–7.430)
pO2, Ven: 43 mmHg (ref 32.0–45.0)
pO2, Ven: 44 mmHg (ref 32.0–45.0)

## 2020-06-03 LAB — POCT I-STAT 7, (LYTES, BLD GAS, ICA,H+H)
Acid-Base Excess: 0 mmol/L (ref 0.0–2.0)
Bicarbonate: 25.1 mmol/L (ref 20.0–28.0)
Calcium, Ion: 1.23 mmol/L (ref 1.15–1.40)
HCT: 39 % (ref 39.0–52.0)
Hemoglobin: 13.3 g/dL (ref 13.0–17.0)
O2 Saturation: 98 %
Potassium: 3.9 mmol/L (ref 3.5–5.1)
Sodium: 141 mmol/L (ref 135–145)
TCO2: 26 mmol/L (ref 22–32)
pCO2 arterial: 43.5 mmHg (ref 32.0–48.0)
pH, Arterial: 7.37 (ref 7.350–7.450)
pO2, Arterial: 106 mmHg (ref 83.0–108.0)

## 2020-06-03 LAB — GLUCOSE, CAPILLARY: Glucose-Capillary: 156 mg/dL — ABNORMAL HIGH (ref 70–99)

## 2020-06-03 SURGERY — RIGHT/LEFT HEART CATH AND CORONARY ANGIOGRAPHY
Anesthesia: LOCAL

## 2020-06-03 MED ORDER — SODIUM CHLORIDE 0.9% FLUSH: 3.0000 mL | Freq: Two times a day (BID) | INTRAVENOUS | Status: DC

## 2020-06-03 MED ORDER — SODIUM CHLORIDE 0.9 % IV SOLN: INTRAVENOUS | Status: AC

## 2020-06-03 MED ORDER — LIDOCAINE HCL (PF) 1 % IJ SOLN
INTRAMUSCULAR | Status: DC | PRN
  Administered 2020-06-03: 5 mL

## 2020-06-03 MED ORDER — SODIUM CHLORIDE 0.9 % IV SOLN: 250.0000 mL | INTRAVENOUS | Status: DC | PRN

## 2020-06-03 MED ORDER — HEPARIN SODIUM (PORCINE) 1000 UNIT/ML IJ SOLN
INTRAMUSCULAR | Status: AC
  Filled 2020-06-03: qty 1

## 2020-06-03 MED ORDER — MIDAZOLAM HCL 2 MG/2ML IJ SOLN
INTRAMUSCULAR | Status: DC | PRN
  Administered 2020-06-03: 2 mg via INTRAVENOUS

## 2020-06-03 MED ORDER — MIDAZOLAM HCL 2 MG/2ML IJ SOLN
INTRAMUSCULAR | Status: AC
  Filled 2020-06-03: qty 2

## 2020-06-03 MED ORDER — HEPARIN (PORCINE) IN NACL 1000-0.9 UT/500ML-% IV SOLN
INTRAVENOUS | Status: DC | PRN
  Administered 2020-06-03 (×2): 500 mL

## 2020-06-03 MED ORDER — VERAPAMIL HCL 2.5 MG/ML IV SOLN
INTRAVENOUS | Status: DC | PRN
  Administered 2020-06-03: 10 mL via INTRA_ARTERIAL

## 2020-06-03 MED ORDER — LIDOCAINE HCL (PF) 1 % IJ SOLN
INTRAMUSCULAR | Status: AC
  Filled 2020-06-03: qty 30

## 2020-06-03 MED ORDER — LABETALOL HCL 5 MG/ML IV SOLN: 10.0000 mg | INTRAVENOUS | Status: DC | PRN

## 2020-06-03 MED ORDER — FENTANYL CITRATE (PF) 100 MCG/2ML IJ SOLN
INTRAMUSCULAR | Status: AC
  Filled 2020-06-03: qty 2

## 2020-06-03 MED ORDER — SODIUM CHLORIDE 0.9% FLUSH: 3.0000 mL | INTRAVENOUS | Status: DC | PRN

## 2020-06-03 MED ORDER — SODIUM CHLORIDE 0.9 % WEIGHT BASED INFUSION
3.0000 mL/kg/h | INTRAVENOUS | Status: AC
  Administered 2020-06-03 (×2): 3 mL/kg/h via INTRAVENOUS

## 2020-06-03 MED ORDER — ACETAMINOPHEN 325 MG PO TABS: 650.0000 mg | ORAL_TABLET | ORAL | Status: DC | PRN

## 2020-06-03 MED ORDER — SODIUM CHLORIDE 0.9 % WEIGHT BASED INFUSION: 1.0000 mL/kg/h | INTRAVENOUS | Status: DC

## 2020-06-03 MED ORDER — FENTANYL CITRATE (PF) 100 MCG/2ML IJ SOLN
INTRAMUSCULAR | Status: DC | PRN
  Administered 2020-06-03: 25 ug via INTRAVENOUS

## 2020-06-03 MED ORDER — ONDANSETRON HCL 4 MG/2ML IJ SOLN: 4.0000 mg | Freq: Four times a day (QID) | INTRAMUSCULAR | Status: DC | PRN

## 2020-06-03 MED ORDER — HYDRALAZINE HCL 20 MG/ML IJ SOLN: 10.0000 mg | INTRAMUSCULAR | Status: DC | PRN

## 2020-06-03 MED ORDER — VERAPAMIL HCL 2.5 MG/ML IV SOLN
INTRAVENOUS | Status: AC
  Filled 2020-06-03: qty 2

## 2020-06-03 MED ORDER — IOHEXOL 350 MG/ML SOLN
INTRAVENOUS | Status: DC | PRN
  Administered 2020-06-03: 150 mL

## 2020-06-03 MED ORDER — VERAPAMIL HCL 2.5 MG/ML IV SOLN
INTRAVENOUS | Status: DC | PRN
  Administered 2020-06-03: 2 mg via INTRA_ARTERIAL

## 2020-06-03 MED ORDER — HEPARIN (PORCINE) IN NACL 1000-0.9 UT/500ML-% IV SOLN
INTRAVENOUS | Status: AC
  Filled 2020-06-03: qty 1000

## 2020-06-03 MED ORDER — HEPARIN SODIUM (PORCINE) 1000 UNIT/ML IJ SOLN
INTRAMUSCULAR | Status: DC | PRN
  Administered 2020-06-03: 4500 [IU] via INTRAVENOUS

## 2020-06-03 SURGICAL SUPPLY — 17 items
CATH BALLN WEDGE 5F 110CM (CATHETERS) ×2 IMPLANT
CATH INFINITI JR4 5F (CATHETERS) ×2 IMPLANT
CATH LAUNCHER 5F NOTO (CATHETERS) ×1 IMPLANT
CATH LAUNCHER 6FR AR1 (CATHETERS) ×2 IMPLANT
CATH OPTITORQUE TIG 4.0 5F (CATHETERS) ×2 IMPLANT
CATHETER LAUNCHER 5F NOTO (CATHETERS) ×2
DEVICE RAD COMP TR BAND LRG (VASCULAR PRODUCTS) ×2 IMPLANT
GLIDESHEATH SLEND SS 6F .021 (SHEATH) ×2 IMPLANT
GUIDEWIRE INQWIRE 1.5J.035X260 (WIRE) ×1 IMPLANT
INQWIRE 1.5J .035X260CM (WIRE) ×2
KIT HEART LEFT (KITS) ×2 IMPLANT
PACK CARDIAC CATHETERIZATION (CUSTOM PROCEDURE TRAY) ×2 IMPLANT
SHEATH 6FR 75 DEST SLENDER (SHEATH) ×2 IMPLANT
SHEATH GLIDE SLENDER 4/5FR (SHEATH) ×2 IMPLANT
SHEATH PROBE COVER 6X72 (BAG) ×2 IMPLANT
TRANSDUCER W/STOPCOCK (MISCELLANEOUS) ×2 IMPLANT
TUBING CIL FLEX 10 FLL-RA (TUBING) ×2 IMPLANT

## 2020-06-03 NOTE — Progress Notes (Signed)
Pt ambulated without difficulty or bleeding.   Discharged home with son  who will drive and stay with pt x 24 hrs 

## 2020-06-03 NOTE — Interval H&P Note (Signed)
History and Physical Interval Note:  06/03/2020 9:28 AM  William Tate  has presented today for surgery, with the diagnosis of CAD,chest pain-> progressive angina, shortness of breath.  The various methods of treatment have been discussed with the patient and family. After consideration of risks, benefits and other options for treatment, the patient has consented to  Procedure(s): RIGHT/LEFT HEART CATH AND CORONARY ANGIOGRAPHY (N/A)  PERCUTANEOUS CORONARY INTERVENTION  as a surgical intervention.  The patient's history has been reviewed, patient examined, no change in status, stable for surgery.  I have reviewed the patient's chart and labs.  Questions were answered to the patient's satisfaction.    Cath Lab Visit (complete for each Cath Lab visit)  Clinical Evaluation Leading to the Procedure:   ACS: No.  Non-ACS:    Anginal Classification: CCS III  Anti-ischemic medical therapy: Minimal Therapy (1 class of medications)  Non-Invasive Test Results: No non-invasive testing performed  Prior CABG: No previous CABG  Bryan Lemma

## 2020-06-03 NOTE — Discharge Instructions (Signed)
 HOLD METFORMIN FOR A FULL 48 HOURS AFTER DISCHARGE.  Radial Site Care  This sheet gives you information about how to care for yourself after your procedure. Your health care provider may also give you more specific instructions. If you have problems or questions, contact your health care provider. What can I expect after the procedure? After the procedure, it is common to have:  Bruising and tenderness at the catheter insertion area. Follow these instructions at home: Medicines  Take over-the-counter and prescription medicines only as told by your health care provider. Insertion site care 1. Follow instructions from your health care provider about how to take care of your insertion site. Make sure you: ? Wash your hands with soap and water before you change your bandage (dressing). If soap and water are not available, use hand sanitizer. ? Change your dressing as told by your health care provider. 2. Check your insertion site every day for signs of infection. Check for: ? Redness, swelling, or pain. ? Fluid or blood. ? Pus or a bad smell. ? Warmth. 3. Do not take baths, swim, or use a hot tub for 5 days. 4. You may shower 24-48 hours after the procedure. ? Remove the dressing and gently wash the site with plain soap and water. ? Pat the area dry with a clean towel. ? Do not rub the site. That could cause bleeding. 5. Do not apply powder or lotion to the site. Activity  1. For 24 hours after the procedure, or as directed by your health care provider: ? Do not flex or bend the affected arm. ? Do not push or pull heavy objects with the affected arm. ? Do not drive yourself home from the hospital or clinic. You may drive 24 hours after the procedure. ? Do not operate machinery or power tools. ? KEEP ARM ELEVATED THE REMAINDER OF THE DAY. 2. Do not push, pull or lift anything that is heavier than 10 lb for 5 days. 3. Ask your health care provider when it is okay to: ? Return to  work or school. ? Resume usual physical activities or sports. ? Resume sexual activity. General instructions  If the catheter site starts to bleed, raise your arm and put firm pressure on the site. If the bleeding does not stop, get help right away. This is a medical emergency.  DRINK PLENTY OF FLUIDS FOR THE NEXT 2-3 DAYS.  If you went home on the same day as your procedure, a responsible adult should be with you for the first 24 hours after you arrive home.  Keep all follow-up visits as told by your health care provider. This is important. Contact a health care provider if:  You have a fever.  You have redness, swelling, or yellow drainage around your insertion site. Get help right away if:  You have unusual pain at the radial site.  The catheter insertion area swells very fast.  The insertion area is bleeding, and the bleeding does not stop when you hold steady pressure on the area.  Your arm or hand becomes pale, cool, tingly, or numb. These symptoms may represent a serious problem that is an emergency. Do not wait to see if the symptoms will go away. Get medical help right away. Call your local emergency services (911 in the [place] Do not drive yourself to the hospital. Summary  After the procedure, it is common to have bruising and tenderness at the site.  Follow instructions from your health care provider   about how to take care of your radial site wound. Check the wound every day for signs of infection.  Do not push, pull or lift anything that is heavier than 10 lb for 5 days.  This information is not intended to replace advice given to you by your health care provider. Make sure you discuss any questions you have with your health care provider. Document Revised: 05/25/2017 Document Reviewed: 05/25/2017 Elsevier Patient Education  2020 [employer] 

## 2020-06-04 ENCOUNTER — Encounter (HOSPITAL_COMMUNITY): Payer: Self-pay | Admitting: Cardiology

## 2020-06-10 DIAGNOSIS — F419 Anxiety disorder, unspecified: Secondary | ICD-10-CM | POA: Diagnosis not present

## 2020-06-10 DIAGNOSIS — E559 Vitamin D deficiency, unspecified: Secondary | ICD-10-CM | POA: Diagnosis not present

## 2020-06-10 DIAGNOSIS — I440 Atrioventricular block, first degree: Secondary | ICD-10-CM | POA: Diagnosis not present

## 2020-06-10 DIAGNOSIS — K219 Gastro-esophageal reflux disease without esophagitis: Secondary | ICD-10-CM | POA: Diagnosis not present

## 2020-06-10 DIAGNOSIS — E785 Hyperlipidemia, unspecified: Secondary | ICD-10-CM | POA: Diagnosis not present

## 2020-06-10 DIAGNOSIS — E1169 Type 2 diabetes mellitus with other specified complication: Secondary | ICD-10-CM | POA: Diagnosis not present

## 2020-06-10 DIAGNOSIS — M159 Polyosteoarthritis, unspecified: Secondary | ICD-10-CM | POA: Diagnosis not present

## 2020-06-10 DIAGNOSIS — E7800 Pure hypercholesterolemia, unspecified: Secondary | ICD-10-CM | POA: Diagnosis not present

## 2020-06-10 DIAGNOSIS — N200 Calculus of kidney: Secondary | ICD-10-CM | POA: Diagnosis not present

## 2020-06-10 DIAGNOSIS — R5382 Chronic fatigue, unspecified: Secondary | ICD-10-CM | POA: Diagnosis not present

## 2020-06-11 DIAGNOSIS — Z9181 History of falling: Secondary | ICD-10-CM | POA: Diagnosis not present

## 2020-06-11 DIAGNOSIS — Z0000 Encounter for general adult medical examination without abnormal findings: Secondary | ICD-10-CM | POA: Diagnosis not present

## 2020-06-11 DIAGNOSIS — Z1331 Encounter for screening for depression: Secondary | ICD-10-CM | POA: Diagnosis not present

## 2020-06-11 DIAGNOSIS — E785 Hyperlipidemia, unspecified: Secondary | ICD-10-CM | POA: Diagnosis not present

## 2020-06-12 ENCOUNTER — Telehealth: Payer: Self-pay | Admitting: Cardiology

## 2020-06-12 NOTE — Telephone Encounter (Signed)
Spoke to [place] with [employer] just now to get the report. I was transferred over to [place] who tells me that they did find some symptomatic mobitz 2 and symptomatic bradycardia. Both of these events happened on 05/26/2020.

## 2020-06-12 NOTE — Telephone Encounter (Signed)
[employer] calling with an end of wear report for this patient.  Use reference # [id number]

## 2020-06-13 NOTE — Telephone Encounter (Signed)
I reviewed online no intervention needed

## 2020-06-16 ENCOUNTER — Telehealth: Payer: Self-pay

## 2020-06-16 ENCOUNTER — Other Ambulatory Visit: Payer: Self-pay

## 2020-06-16 NOTE — Telephone Encounter (Signed)
Spoke with patient regarding results and recommendation.  Patient verbalizes understanding and is agreeable to plan of care. Advised patient to call back with any issues or concerns.  

## 2020-06-16 NOTE — Telephone Encounter (Signed)
-----   Message from Baldo Daub, MD sent at 06/16/2020  8:00 AM EST ----- His monitor shows frequent episodes of slow heart rate.  He should have an office appointment scheduled.

## 2020-06-17 ENCOUNTER — Ambulatory Visit: Payer: Medicare HMO | Admitting: Cardiology

## 2020-06-17 ENCOUNTER — Encounter: Payer: Self-pay | Admitting: Cardiology

## 2020-06-17 ENCOUNTER — Other Ambulatory Visit: Payer: Self-pay

## 2020-06-17 VITALS — BP 120/80 | HR 60 | Ht 74.0 in | Wt 206.0 lb

## 2020-06-17 DIAGNOSIS — I2510 Atherosclerotic heart disease of native coronary artery without angina pectoris: Secondary | ICD-10-CM

## 2020-06-17 DIAGNOSIS — I455 Other specified heart block: Secondary | ICD-10-CM

## 2020-06-17 DIAGNOSIS — R5383 Other fatigue: Secondary | ICD-10-CM | POA: Diagnosis not present

## 2020-06-17 DIAGNOSIS — E782 Mixed hyperlipidemia: Secondary | ICD-10-CM

## 2020-06-17 DIAGNOSIS — I441 Atrioventricular block, second degree: Secondary | ICD-10-CM

## 2020-06-17 DIAGNOSIS — R0602 Shortness of breath: Secondary | ICD-10-CM

## 2020-06-17 NOTE — Patient Instructions (Signed)

## 2020-06-17 NOTE — Progress Notes (Signed)
Cardiology Office Note:    Date:  06/17/2020   ID:  MANJINDER BREAU, DOB Feb 01, 1951, MRN [id number]  PCP:  Cher Nakai, MD  Cardiologist:  No primary care provider on file.  Electrophysiologist:  None   Referring MD: Cher Nakai, MD   Chief Complaint  Patient presents with  . Follow-up    History of Present Illness:    William Tate is a 70 y.o. male with a hx of mild coronary artery disease, type 2 diabetes, hyperlipidemia is here today for follow-up visit.  When I saw the patient May 30, 2020 at that time he was experiencing precordial chest pain as well as shortness of breath given his risk factors proceeded with a right and left heart catheterization.  He had had his catheterization which showed mild coronary artery disease with normal wedge pressures.  He still experiencing shortness of breath and worsening fatigue.  And he is here today for follow-up visit.  Past Medical History:  Diagnosis Date  . GERD (gastroesophageal reflux disease)   . Hyperlipidemia   . Type 2 diabetes mellitus ([place])     Past Surgical History:  Procedure Laterality Date  . REPLACEMENT TOTAL KNEE Left   . RIGHT/LEFT HEART CATH AND CORONARY ANGIOGRAPHY N/A 06/03/2020   Procedure: RIGHT/LEFT HEART CATH AND CORONARY ANGIOGRAPHY;  Surgeon: Leonie Man, MD;  Location: [facility];  Service: Cardiovascular;  Laterality: N/A;    Current Medications: Current Meds  Medication Sig  . aspirin 81 MG EC tablet Take 81 mg by mouth daily. Swallow whole.  Marland Kitchen atorvastatin (LIPITOR) 10 MG tablet Take 10 mg by mouth daily.  . Cholecalciferol (VITAMIN D3) 25 MCG (1000 UT) CAPS Take 1 Units by mouth daily.  . citalopram (CELEXA) 10 MG tablet Take 10 mg by mouth daily.  Marland Kitchen glimepiride (AMARYL) 4 MG tablet Take 2 mg by mouth daily with lunch.  . ibuprofen (ADVIL) 200 MG tablet Take 800 mg by mouth every 8 (eight) hours as needed (for pain).  . metFORMIN (GLUCOPHAGE) 850 MG tablet Take 850 mg by mouth in  the morning and at bedtime.  . nitroGLYCERIN (NITROSTAT) 0.4 MG SL tablet Place 1 tablet (0.4 mg total) under the tongue every 5 (five) minutes as needed for chest pain.  Marland Kitchen omeprazole (PRILOSEC) 20 MG capsule Take 20 mg by mouth daily.  . vitamin C (ASCORBIC ACID) 500 MG tablet Take 500 mg by mouth daily.     Allergies:   Patient has no known allergies.   Social History   Socioeconomic History  . Marital status: Married    Spouse name: Not on file  . Number of children: Not on file  . Years of education: Not on file  . Highest education level: Not on file  Occupational History  . Not on file  Tobacco Use  . Smoking status: Former Research scientist (life sciences)  . Smokeless tobacco: Never Used  Vaping Use  . Vaping Use: Never used  Substance and Sexual Activity  . Alcohol use: Never  . Drug use: Never  . Sexual activity: Not on file  Other Topics Concern  . Not on file  Social History Narrative  . Not on file   Social Determinants of Health   Financial Resource Strain: Not on file  Food Insecurity: Not on file  Transportation Needs: Not on file  Physical Activity: Not on file  Stress: Not on file  Social Connections: Not on file     Family History: The patient's family  history includes Diabetes in his brother and maternal grandmother; Heart Problems in his sister; Heart disease in his brother; Hypertension in his brother, father, and mother; Stroke in his mother.  ROS:   Review of Systems  Constitution: Negative for decreased appetite, fever and weight gain.  HENT: Negative for congestion, ear discharge, hoarse voice and sore throat.   Eyes: Negative for discharge, redness, vision loss in right eye and visual halos.  Cardiovascular: Negative for chest pain, dyspnea on exertion, leg swelling, orthopnea and palpitations.  Respiratory: Negative for cough, hemoptysis, shortness of breath and snoring.   Endocrine: Negative for heat intolerance and polyphagia.  Hematologic/Lymphatic: Negative  for bleeding problem. Does not bruise/bleed easily.  Skin: Negative for flushing, nail changes, rash and suspicious lesions.  Musculoskeletal: Negative for arthritis, joint pain, muscle cramps, myalgias, neck pain and stiffness.  Gastrointestinal: Negative for abdominal pain, bowel incontinence, diarrhea and excessive appetite.  Genitourinary: Negative for decreased libido, genital sores and incomplete emptying.  Neurological: Negative for brief paralysis, focal weakness, headaches and loss of balance.  Psychiatric/Behavioral: Negative for altered mental status, depression and suicidal ideas.  Allergic/Immunologic: Negative for HIV exposure and persistent infections.    EKGs/Labs/Other Studies Reviewed:    The following studies were reviewed today:   EKG:  T none today  ZIO monitor Patch Wear Time:  12 days and 5 hours (2022-01-19T15:22:36-0500 to 2022-01-31T20:42:36-0500)  Patient had a min HR of 22 bpm, max HR of 140 bpm, and avg HR of 55 bpm. Predominant underlying rhythm was Sinus Rhythm. First Degree AV Block was present. 1 run of Ventricular Tachycardia occurred lasting 4 beats with a max rate of 140 bpm (avg 125  bpm). 1 Pause occurred lasting 3.5 secs (17 bpm). 1085 episode(s) of AV Block (2nd Mobitz II) occurred, lasting a total of 11 hours 54 mins. Second Degree AV Block-Mobitz I (Wenckebach) was present. AV Block (2nd Mobitz II) and Wenckebach were  detected within +/- 45 seconds of symptomatic patient event(s). Isolated SVEs were occasional (1.8%, 16739), SVE Couplets were rare (<1.0%, 1581), and no SVE Triplets were present. Isolated VEs were rare (<1.0%), VE Couplets were rare (<1.0%), and no VE  Triplets were present. MD notification criteria for Second Degree AV Block-Mobitz II and Symptomatic Bradycardia met - notified Garwin Brothers, RN on 12 Jun 2020 at 3:15 pm CT (KT).  ZIO monitor was performed for 13 days to evaluate bradycardia. Rhythm throughout was sinus average  minimum maximal heart rates of 57, 39 and 138 bpm. First-degree AV block was seen. There were frequent episodes of Mobitz 1 second-degree AV block rarely 2-1 which occurred an effective rate of 35 bpm at 4:59 AM.  There is one single pause of 3.5 seconds noticed.  There are no episodes of Mobitz 2 second-degree AV block or sinus node exit block.  At higher heart rates with sinus tachycardia there is generally one-to-one conduction. Ventricular ectopy is rare with PVCs and couplets and 1 4 beat run of PVCs.. Supraventricular ectopy was occasional.  There were no episodes of atrial fibrillation or flutter. There were 42 triggered and 50 diary events overwhelmingly associated with Mobitz 1 second-degree AV block.   Conclusion Mobitz 1 second-degree AV block.   Recent Labs: 05/30/2020: BUN 11; Creatinine, Ser 0.87; Magnesium 1.9; Platelets 235 06/03/2020: Hemoglobin 13.3; Hemoglobin 12.9; Potassium 3.9; Potassium 3.8; Sodium 141; Sodium 140  Recent Lipid Panel No results found for: CHOL, TRIG, HDL, CHOLHDL, VLDL, LDLCALC, LDLDIRECT  Physical Exam:    VS:  BP  120/80 (BP Location: Left Arm, Patient Position: Sitting, Cuff Size: Normal)   Pulse 60   Ht '6\' 2"'  (1.88 m)   Wt 206 lb (93.4 kg)   SpO2 97%   BMI 26.45 kg/m     Wt Readings from Last 3 Encounters:  06/17/20 206 lb (93.4 kg)  06/03/20 206 lb (93.4 kg)  05/30/20 205 lb (93 kg)     GEN: Well nourished, well developed in no acute distress HEENT: Normal NECK: No JVD; No carotid bruits LYMPHATICS: No lymphadenopathy CARDIAC: S1S2 noted,RRR, no murmurs, rubs, gallops RESPIRATORY:  Clear to auscultation without rales, wheezing or rhonchi  ABDOMEN: Soft, non-tender, non-distended, +bowel sounds, no guarding. EXTREMITIES: No edema, No cyanosis, no clubbing MUSCULOSKELETAL:  No deformity  SKIN: Warm and dry NEUROLOGIC:  Alert and oriented x 3, non-focal PSYCHIATRIC:  Normal affect, good insight  ASSESSMENT:    1. Shortness of  breath   2. Second degree AV block   3. Sinus pause   4. Other fatigue   5. Coronary artery disease involving native coronary artery of native heart without angina pectoris   6. Mixed hyperlipidemia    PLAN:    I was able to personally review his monitor results which showed multiple episodes of type II second-degree AV block with some pain second-degree type II and one episode of 3.5-second sinus pause.  The patient is significantly symptomatic he does have shortness of breath his wife said now he is so fatigued that he can barely do anything at home.  I am placing an urgent referral for EP for the patient to be evaluated for pacemaker implantation.  His blood pressure is acceptable in the office today.  He has no anginal symptoms he has mild coronary artery disease from recent heart catheterization.  Right and left heart pressures are all normal.  Continue patient on his Lipitor 10 mg daily.  This is being managed by his primary care doctor.  No adjustments for antidiabetic medications were made today.   The patient is in agreement with the above plan. The patient left the office in stable condition.  The patient will follow up in   Medication Adjustments/Labs and Tests Ordered: Current medicines are reviewed at length with the patient today.  Concerns regarding medicines are outlined above.  Orders Placed This Encounter  Procedures  . Ambulatory referral to Cardiac Electrophysiology   No orders of the defined types were placed in this encounter.   Patient Instructions  Medication Instructions:  Your physician recommends that you continue on your current medications as directed. Please refer to the Current Medication list given to you today.  *If you need a refill on your cardiac medications before your next appointment, please call your pharmacy*   Lab Work: None If you have labs (blood work) drawn today and your tests are completely normal, you will receive your results  only by: Marland Kitchen MyChart Message (if you have MyChart) OR . A paper copy in the mail If you have any lab test that is abnormal or we need to change your treatment, we will call you to review the results.   Testing/Procedures: None   Follow-Up: At [facility], you and your health needs are our priority.  As part of our continuing mission to provide you with exceptional heart care, we have created designated Provider Care Teams.  These Care Teams include your primary Cardiologist (physician) and Advanced Practice Providers (APPs -  Physician Assistants and Nurse Practitioners) who all work together to provide  you with the care you need, when you need it.  We recommend signing up for the patient portal called "MyChart".  Sign up information is provided on this After Visit Summary.  MyChart is used to connect with patients for Virtual Visits (Telemedicine).  Patients are able to view lab/test results, encounter notes, upcoming appointments, etc.  Non-urgent messages can be sent to your provider as well.   To learn more about what you can do with MyChart, go to [email or web address].    Your next appointment:   3 month(s)  The format for your next appointment:   In Person  Provider:   Berniece Salines, DO   Other Instructions      Adopting a Healthy Lifestyle.  Know what a healthy weight is for you (roughly BMI <25) and aim to maintain this   Aim for 7+ servings of fruits and vegetables daily   65-80+ fluid ounces of water or unsweet tea for healthy kidneys   Limit to max 1 drink of alcohol per day; avoid smoking/tobacco   Limit animal fats in diet for cholesterol and heart health - choose grass fed whenever available   Avoid highly processed foods, and foods high in saturated/trans fats   Aim for low stress - take time to unwind and care for your mental health   Aim for 150 min of moderate intensity exercise weekly for heart health, and weights twice weekly for bone health    Aim for 7-9 hours of sleep daily   When it comes to diets, agreement about the perfect plan isnt easy to find, even among the experts. Experts at the [employer] developed an idea known as the Healthy Eating Plate. Just imagine a plate divided into logical, healthy portions.   The emphasis is on diet quality:   Load up on vegetables and fruits - one-half of your plate: Aim for color and variety, and remember that potatoes dont count.   Go for whole grains - one-quarter of your plate: Whole wheat, barley, wheat berries, quinoa, oats, brown rice, and foods made with them. If you want pasta, go with whole wheat pasta.   Protein power - one-quarter of your plate: Fish, chicken, beans, and nuts are all healthy, versatile protein sources. Limit red meat.   The diet, however, does go beyond the plate, offering a few other suggestions.   Use healthy plant oils, such as olive, canola, soy, corn, sunflower and peanut. Check the labels, and avoid partially hydrogenated oil, which have unhealthy trans fats.   If youre thirsty, drink water. Coffee and tea are good in moderation, but skip sugary drinks and limit milk and dairy products to one or two daily servings.   The type of carbohydrate in the diet is more important than the amount. Some sources of carbohydrates, such as vegetables, fruits, whole grains, and beans-are healthier than others.   Finally, stay active  Signed, Berniece Salines, DO  06/17/2020 10:12 AM    [place]

## 2020-06-20 ENCOUNTER — Ambulatory Visit: Payer: Medicare HMO | Admitting: Cardiology

## 2020-06-23 ENCOUNTER — Other Ambulatory Visit: Payer: Self-pay

## 2020-06-23 ENCOUNTER — Ambulatory Visit: Payer: Medicare HMO | Admitting: Cardiology

## 2020-06-23 ENCOUNTER — Encounter: Payer: Self-pay | Admitting: Cardiology

## 2020-06-23 VITALS — BP 122/78 | HR 58 | Ht 74.0 in | Wt 206.4 lb

## 2020-06-23 DIAGNOSIS — I441 Atrioventricular block, second degree: Secondary | ICD-10-CM

## 2020-06-23 DIAGNOSIS — Z01812 Encounter for preprocedural laboratory examination: Secondary | ICD-10-CM

## 2020-06-23 LAB — CBC
Hematocrit: 41.9 % (ref 37.5–51.0)
Hemoglobin: 14.3 g/dL (ref 13.0–17.7)
MCH: 32.6 pg (ref 26.6–33.0)
MCHC: 34.1 g/dL (ref 31.5–35.7)
MCV: 95 fL (ref 79–97)
Platelets: 226 10*3/uL (ref 150–450)
RBC: 4.39 x10E6/uL (ref 4.14–5.80)
RDW: 12.6 % (ref 11.6–15.4)
WBC: 6.4 10*3/uL (ref 3.4–10.8)

## 2020-06-23 LAB — BASIC METABOLIC PANEL
BUN/Creatinine Ratio: 17 (ref 10–24)
BUN: 16 mg/dL (ref 8–27)
CO2: 21 mmol/L (ref 20–29)
Calcium: 9.6 mg/dL (ref 8.6–10.2)
Chloride: 102 mmol/L (ref 96–106)
Creatinine, Ser: 0.94 mg/dL (ref 0.76–1.27)
GFR calc Af Amer: 95 mL/min/{1.73_m2} (ref >59)
GFR calc non Af Amer: 82 mL/min/{1.73_m2} (ref >59)
Glucose: 136 mg/dL — ABNORMAL HIGH (ref 65–99)
Potassium: 4.4 mmol/L (ref 3.5–5.2)
Sodium: 142 mmol/L (ref 134–144)

## 2020-06-23 NOTE — Patient Instructions (Addendum)
Medication Instructions:  Your physician recommends that you continue on your current medications as directed. Please refer to the Current Medication list given to you today.  *If you need a refill on your cardiac medications before your next appointment, please call your pharmacy*   Lab Work: Pre procedure lab today: BMET & CBC If you have labs (blood work) drawn today and your tests are completely normal, you will receive your results only by: Marland Kitchen. MyChart Message (if you have MyChart) OR . A paper copy in the mail If you have any lab test that is abnormal or we need to change your treatment, we will call you to review the results.   Testing/Procedures: Your physician has recommended that you have a pacemaker inserted. A pacemaker is a small device that is placed under the skin of your chest or abdomen to help control abnormal heart rhythms. This device uses electrical pulses to prompt the heart to beat at a normal rate. Pacemakers are used to treat heart rhythms that are too slow. Wire (leads) are attached to the pacemaker that goes into the chambers of you heart. This is done in the hospital and usually requires and overnight stay. Please see the instructions below located under "other instructions".   Follow-Up: At [facility], you and your health needs are our priority.  As part of our continuing mission to provide you with exceptional heart care, we have created designated Provider Care Teams.  These Care Teams include your primary Cardiologist (physician) and Advanced Practice Providers (APPs -  Physician Assistants and Nurse Practitioners) who all work together to provide you with the care you need, when you need it.    Your next appointment:   2 week(s) after your pacemaker implant  The format for your next appointment:   In Person  Provider:   device clinic for a wound check    Thank you for choosing [facility]!!   Dory HornSherri Tim Wilhide, RN [phone]   Other  Instructions   Implantable Device Instructions  You are scheduled for: Permanent pacemaker implant on 07/04/20 with Dr. Elberta Fortisamnitz.  1.   Pre procedure testing-             A.  LAB WORK--- On 06/23/20  for your pre procedure blood work.  You do NOT need to be fasting.              B. COVID TEST-- On 07/02/2020 @ 10:00 am - This is a Drive Up Visit at [id number] [place], [place] [id number].  Someone will direct you to the appropriate testing line. Stay in your car and someone will be with you shortly.   After you are tested please go home and self quarantine until the day of your procedure.    2. On the day of your procedure 07/04/2020 you will [facility] to [facility] [phone]([address]) at 11:30 am.  Bonita QuinYou will go to the main entrance [employer] [employer] and enter where the [employer] parking staff are.  You will check in at ADMITTING.  You may have one support person come in to the hospital with you.  They will be asked to wait in the waiting room.   3.   Do not eat or drink after midnight prior to your procedure.   4.   On the morning of your procedure do NOT take any medication.  5.  The night before your procedure and the morning of your procedure scrub your neck/chest with  surgical scrub.  See instruction letter below.   5.  Plan for an overnight stay, but you may be discharged home after your procedure. If you use your phone frequently bring your phone charger, in case you have to stay.  If you are discharged after your procedure you will need someone to drive you home and be with your for 24 hours after your procedure.   6.  You will follow up with the [facility] Device clinic 10-14 days after your procedure. You will follow up with Dr. Elberta Fortis 91 days after your procedure.  These appointments will be made for you.   * If you have ANY questions after you get home, please call the office [phone] and ask for Ariston Grandison RN or send a MyChart message.    [place] - Preparing For Surgery  (surgical scrub)   Before surgery, you can play an important role. Because skin is not sterile, your skin needs to be as free of germs as possible. You can reduce the number of germs on your skin by washing with CHG (chlorahexidine gluconate) Soap before surgery.  CHG is an antiseptic cleaner which kills germs and bonds with the skin to continue killing germs even after washing.   Please do not use if you have an allergy to CHG or antibacterial soaps.  If your skin becomes reddened/irritated stop using the CHG.   Do not shave (including legs and underarms) for at least 48 hours prior to first CHG shower.  It is OK to shave your face.  Please follow these instructions carefully:  1.  Shower the night before surgery and the morning of surgery with CHG.  2.  If you choose to wash your hair, wash your hair first as usual with your normal shampoo.  3.  After you shampoo, rinse your hair and body thoroughly to remove the shampoo.  4.  Use CHG as you would any other liquid soap.  You can apply CHG directly to the skin and wash gently with a clean washcloth. 5.  Apply the CHG Soap to your body ONLY FROM THE NECK DOWN.  Do not use on open wounds or open sores.  Avoid contact with your eyes, ears, mouth and genitals (private parts).  Wash genitals (private parts) with your normal soap.  6.  Wash thoroughly, paying special attention to the area where your surgery will be performed.  7.  Thoroughly rinse your body with warm water from the neck down.   8.  DO NOT shower/wash with your normal soap after using and rinsing off the CHG soap.  9.  Pat yourself dry with a clean towel.           10.  Wear clean pajamas.           11.  Place clean sheets on your bed the night of your first shower and do not sleep with pets.  Day of Surgery: Do not apply any deodorants/lotions.  Please wear clean clothes to the hospital/surgery center.    Pacemaker Implantation, Adult Pacemaker implantation is a procedure to  place a pacemaker inside the chest. A pacemaker is a small computer that sends electrical signals to the heart and helps the heart beat normally. A pacemaker also stores information about heart rhythms. You may need pacemaker implantation if you have:  A slow heartbeat (bradycardia).  Loss of consciousness that happens repeatedly (syncope) or repeated episodes of dizziness or light-headedness because of an irregular heart rate.  Shortness  of breath (dyspnea) due to heart problems. The pacemaker usually attaches to your heart through a wire called a lead. One or two leads may be needed. There are different types of pacemakers:  Transvenous pacemaker. This type is placed under the skin or muscle of your upper chest area. The lead goes through a vein in the chest area to reach the inside of the heart.  Epicardial pacemaker. This type is placed under the skin or muscle of your chest or abdomen. The lead goes through your chest to the outside of the heart. Tell a health care provider about:  Any allergies you have.  All medicines you are taking, including vitamins, herbs, eye drops, creams, and over-the-counter medicines.  Any problems you or family members have had with anesthetic medicines.  Any blood or bone disorders you have.  Any surgeries you have had.  Any medical conditions you have.  Whether you are pregnant or may be pregnant. What are the risks? Generally, this is a safe procedure. However, problems may occur, including:  Infection.  Bleeding.  Failure of the pacemaker or the lead.  Collapse of a lung or bleeding into a lung.  Blood clot inside a blood vessel with a lead.  Damage to the heart.  Infection inside the heart (endocarditis).  Allergic reactions to medicines. What happens before the procedure? Staying hydrated Follow instructions from your health care provider about hydration, which may include:  Up to 2 hours before the procedure - you may continue  to drink clear liquids, such as water, clear fruit juice, black coffee, and plain tea.   Eating and drinking restrictions Follow instructions from your health care provider about eating and drinking, which may include:  8 hours before the procedure - stop eating heavy meals or foods, such as meat, fried foods, or fatty foods.  6 hours before the procedure - stop eating light meals or foods, such as toast or cereal.  6 hours before the procedure - stop drinking milk or drinks that contain milk.  2 hours before the procedure - stop drinking clear liquids. Medicines Ask your health care provider about:  Changing or stopping your regular medicines. This is especially important if you are taking diabetes medicines or blood thinners.  Taking medicines such as aspirin and ibuprofen. These medicines can thin your blood. Do not take these medicines unless your health care provider tells you to take them.  Taking over-the-counter medicines, vitamins, herbs, and supplements. Tests You may have:  A heart evaluation. This may include: ? An electrocardiogram (ECG). This involves placing patches on your skin to check your heart rhythm. ? A chest X-ray. ? An echocardiogram. This is a test that uses sound waves (ultrasound) to produce an image of the heart. ? A cardiac rhythm monitor. This is used to record your heart rhythm and any events for a longer period of time.  Blood tests.  Genetic testing. General instructions  Do not use any products that contain nicotine or tobacco for at least 4 weeks before the procedure. These products include cigarettes, e-cigarettes, and chewing tobacco. If you need help quitting, ask your health care provider.  Ask your health care provider: ? How your surgery site will be marked. ? What steps will be taken to help prevent infection. These steps may include:  Removing hair at the surgery site.  Washing skin with a germ-killing soap.  Receiving antibiotic  medicine.  Plan to have someone take you home from the hospital or clinic.  If you will be going home right after the procedure, plan to have someone with you for 24 hours. What happens during the procedure?  An IV will be inserted into one of your veins.  You will be given one or more of the following: ? A medicine to help you relax (sedative). ? A medicine to numb the area (local anesthetic). ? A medicine to make you fall asleep (general anesthetic).  The next steps vary depending on the type of pacemaker you will be getting. ? If you are getting a transvenous pacemaker:  An incision will be made in your upper chest.  A pocket will be made for the pacemaker. It may be placed under the skin or between layers of muscle.  The lead will be inserted into a blood vessel that goes to the heart.  While X-rays are taken by an imaging machine (fluoroscopy), the lead will be advanced through the vein to the inside of your heart.  The other end of the lead will be tunneled under the skin and attached to the pacemaker. ? If you are getting an epicardial pacemaker:  An incision will be made near your ribs or breastbone (sternum) for the lead.  The lead will be attached to the outside of your heart.  Another incision will be made in your chest or upper abdomen to create a pocket for the pacemaker.  The free end of the lead will be tunneled under the skin and attached to the pacemaker.  The transvenous or epicardial pacemaker will be tested. Imaging studies may be done to check the lead position.  The incisions will be closed with stitches (sutures), adhesive strips, or skin glue.  Bandages (dressings) will be placed over the incisions. The procedure may vary among health care providers and hospitals. What happens after the procedure?  Your blood pressure, heart rate, breathing rate, and blood oxygen level will be monitored until you leave the hospital or clinic.  You may be given  antibiotics.  You will be given pain medicine.  An ECG and chest X-rays will be done.  You may need to wear a continuous type of ECG (Holter monitor) to check your heart rhythm.  Your health care provider will program the pacemaker.  If you were given a sedative during the procedure, it can affect you for several hours. Do not drive or operate machinery until your health care provider says that it is safe.  You will be given a pacemaker identification card. This card lists the implant date, device model, and manufacturer of your pacemaker. Summary  A pacemaker is a small computer that sends electrical signals to the heart and helps the heart beat normally.  There are different types of pacemakers. A pacemaker may be placed under the skin or muscle of your chest or abdomen.  Follow instructions from your health care provider about eating and drinking and about taking medicines before the procedure. This information is not intended to replace advice given to you by your health care provider. Make sure you discuss any questions you have with your health care provider. Document Revised: 03/21/2019 Document Reviewed: 03/21/2019 Elsevier Patient Education  2021 [employer].

## 2020-06-23 NOTE — Progress Notes (Signed)
Electrophysiology Office Note   Date:  06/23/2020   ID:  William Tate, DOB 10-24-1950, MRN [id number]  PCP:  Simone Curia, MD  Cardiologist:  Tobb Primary Electrophysiologist:  Sheanna Dail Jorja Loa, MD    Chief Complaint: fatigue   History of Present Illness: William [name] is a 70 y.o. male who is being seen today for the evaluation of second degree AV block at the request of Tobb, Kardie, DO. Presenting today for electrophysiology evaluation.  He has a history significant for hyperlipidemia and type 2 diabetes. He had a left heart catheterization that showed mild nonobstructive coronary artery disease. He developed worsening shortness of breath and fatigue. He wore a cardiac monitor that showed Mobitz 1 AV block which was associated with his symptoms.  He also feels palpitations at times.  He checks his heart rates at home and has noted heart rates in the 140s.  He is also noted heart rate in the 20s to 30s.  He most the time feels weak and fatigued.  He finds it difficult at times to walk through his house due to shortness of breath.  Today, he denies symptoms of , chest pain, orthopnea, PND, lower extremity edema, claudication, dizziness, presyncope, syncope, bleeding, or neurologic sequela. The patient is tolerating medications without difficulties.    Past Medical History:  Diagnosis Date  . GERD (gastroesophageal reflux disease)   . Hyperlipidemia   . Type 2 diabetes mellitus (HCC)    Past Surgical History:  Procedure Laterality Date  . REPLACEMENT TOTAL KNEE Left   . RIGHT/LEFT HEART CATH AND CORONARY ANGIOGRAPHY N/A 06/03/2020   Procedure: RIGHT/LEFT HEART CATH AND CORONARY ANGIOGRAPHY;  Surgeon: Marykay Lex, MD;  Location: [facility] INVASIVE CV LAB;  Service: Cardiovascular;  Laterality: N/A;     Current Outpatient Medications  Medication Sig Dispense Refill  . aspirin 81 MG EC tablet Take 81 mg by mouth daily. Swallow whole.    Marland Kitchen atorvastatin (LIPITOR) 10 MG tablet Take  10 mg by mouth daily.    . Cholecalciferol (VITAMIN D3) 25 MCG (1000 UT) CAPS Take 1 Units by mouth daily.    . citalopram (CELEXA) 10 MG tablet Take 10 mg by mouth daily.    Marland Kitchen glimepiride (AMARYL) 4 MG tablet Take 2 mg by mouth daily with lunch.    . ibuprofen (ADVIL) 200 MG tablet Take 800 mg by mouth every 8 (eight) hours as needed (for pain).    . metFORMIN (GLUCOPHAGE) 850 MG tablet Take 850 mg by mouth in the morning and at bedtime.    . nitroGLYCERIN (NITROSTAT) 0.4 MG SL tablet Place 1 tablet (0.4 mg total) under the tongue every 5 (five) minutes as needed for chest pain. 90 tablet 3  . omeprazole (PRILOSEC) 20 MG capsule Take 20 mg by mouth daily.    . vitamin C (ASCORBIC ACID) 500 MG tablet Take 500 mg by mouth daily.     No current facility-administered medications for this visit.    Allergies:   Patient has no known allergies.   Social History:  The patient  reports that he has quit smoking. He has never used smokeless tobacco. He reports that he does not drink alcohol and does not use drugs.   Family History:  The patient's family history includes Diabetes in his brother and maternal grandmother; Heart Problems in his sister; Heart disease in his brother; Hypertension in his brother, father, and mother; Stroke in his mother.    ROS:  Please see the  history of present illness.   Otherwise, review of systems is positive for none.   All other systems are reviewed and negative.    PHYSICAL EXAM: VS:  BP 122/78   Pulse (!) 58   Ht 6\' 2"  (1.88 m)   Wt 206 lb 6.4 oz (93.6 kg)   SpO2 97%   BMI 26.50 kg/m  , BMI Body mass index is 26.5 kg/m. GEN: Well nourished, well developed, in no acute distress  HEENT: normal  Neck: no JVD, carotid bruits, or masses Cardiac: RRR; no murmurs, rubs, or gallops,no edema  Respiratory:  clear to auscultation bilaterally, normal work of breathing GI: soft, nontender, nondistended, + BS MS: no deformity or atrophy  Skin: warm and dry Neuro:   Strength and sensation are intact Psych: euthymic mood, full affect  EKG:  EKG is ordered today. Personal review of the ekg ordered shows sinus rhythm, rate 58  Recent Labs: 05/30/2020: BUN 11; Creatinine, Ser 0.87; Magnesium 1.9; Platelets 235 06/03/2020: Hemoglobin 13.3; Hemoglobin 12.9; Potassium 3.9; Potassium 3.8; Sodium 141; Sodium 140    Lipid Panel  No results found for: CHOL, TRIG, HDL, CHOLHDL, VLDL, LDLCALC, LDLDIRECT   Wt Readings from Last 3 Encounters:  06/23/20 206 lb 6.4 oz (93.6 kg)  06/17/20 206 lb (93.4 kg)  06/03/20 206 lb (93.4 kg)      Other studies Reviewed: Additional studies/ records that were reviewed today include: LHC 06/03/20  Review of the above records today demonstrates:   The left ventricular systolic function is normal. The left ventricular ejection fraction is 55-65% by visual estimate.  LV end diastolic pressure is normal.  Normal Right Heart Cath Numbers-Cardiac Output Index and Pressures.  Mid LAD lesion is 20% stenosed. Otherwise minimal CAD.  Cardiac monitor 06/16/2020 personally reviewed Mobitz 1 second-degree AV block with episodes of SVT    ASSESSMENT AND PLAN:  1. Mobitz 1 second-degree AV block: Has been having significant shortness of breath and fatigue. At this point, this could be caused by his Mobitz 1 heart block. He does have significant bradycardia associated with the symptoms. Due to that, he would benefit from pacemaker implant. Risks and benefits were discussed risk of bleeding, tamponade, infection, pneumothorax. The patient understands these risks and has agreed to the procedure.    Current medicines are reviewed at length with the patient today.   The patient does not have concerns regarding his medicines.  The following changes were made today:  none  Labs/ tests ordered today include:  Orders Placed This Encounter  Procedures  . Basic metabolic panel  . CBC  . EKG 12-Lead     Disposition:   FU with Shalandria Elsbernd 3 months  Signed, Kilea Mccarey 06/18/2020, MD  06/23/2020 10:37 AM     [facility] [address] [place] [phone] (office) [phone] (fax)

## 2020-06-23 NOTE — H&P (View-Only) (Signed)
 Electrophysiology Office Note   Date:  06/23/2020   ID:  William Tate, DOB 06/09/1950, MRN [id number]  PCP:  Lee, Keung, MD  Cardiologist:  Tobb Primary Electrophysiologist:  Leiana Rund Martin Twala Collings, MD    Chief Complaint: fatigue   History of Present Illness: William Tate is a 70 y.o. male who is being seen today for the evaluation of second degree AV block at the request of Tobb, Kardie, DO. Presenting today for electrophysiology evaluation.  He has a history significant for hyperlipidemia and type 2 diabetes. He had a left heart catheterization that showed mild nonobstructive coronary artery disease. He developed worsening shortness of breath and fatigue. He wore a cardiac monitor that showed Mobitz 1 AV block which was associated with his symptoms.  He also feels palpitations at times.  He checks his heart rates at home and has noted heart rates in the 140s.  He is also noted heart rate in the 20s to 30s.  He most the time feels weak and fatigued.  He finds it difficult at times to walk through his house due to shortness of breath.  Today, he denies symptoms of , chest pain, orthopnea, PND, lower extremity edema, claudication, dizziness, presyncope, syncope, bleeding, or neurologic sequela. The patient is tolerating medications without difficulties.    Past Medical History:  Diagnosis Date  . GERD (gastroesophageal reflux disease)   . Hyperlipidemia   . Type 2 diabetes mellitus (HCC)    Past Surgical History:  Procedure Laterality Date  . REPLACEMENT TOTAL KNEE Left   . RIGHT/LEFT HEART CATH AND CORONARY ANGIOGRAPHY N/A 06/03/2020   Procedure: RIGHT/LEFT HEART CATH AND CORONARY ANGIOGRAPHY;  Surgeon: Harding, David W, MD;  Location: [facility];  Service: Cardiovascular;  Laterality: N/A;     Current Outpatient Medications  Medication Sig Dispense Refill  . aspirin 81 MG EC tablet Take 81 mg by mouth daily. Swallow whole.    . atorvastatin (LIPITOR) 10 MG tablet Take  10 mg by mouth daily.    . Cholecalciferol (VITAMIN D3) 25 MCG (1000 UT) CAPS Take 1 Units by mouth daily.    . citalopram (CELEXA) 10 MG tablet Take 10 mg by mouth daily.    . glimepiride (AMARYL) 4 MG tablet Take 2 mg by mouth daily with lunch.    . ibuprofen (ADVIL) 200 MG tablet Take 800 mg by mouth every 8 (eight) hours as needed (for pain).    . metFORMIN (GLUCOPHAGE) 850 MG tablet Take 850 mg by mouth in the morning and at bedtime.    . nitroGLYCERIN (NITROSTAT) 0.4 MG SL tablet Place 1 tablet (0.4 mg total) under the tongue every 5 (five) minutes as needed for chest pain. 90 tablet 3  . omeprazole (PRILOSEC) 20 MG capsule Take 20 mg by mouth daily.    . vitamin C (ASCORBIC ACID) 500 MG tablet Take 500 mg by mouth daily.     No current facility-administered medications for this visit.    Allergies:   Patient has no known allergies.   Social History:  The patient  reports that he has quit smoking. He has never used smokeless tobacco. He reports that he does not drink alcohol and does not use drugs.   Family History:  The patient's family history includes Diabetes in his brother and maternal grandmother; Heart Problems in his sister; Heart disease in his brother; Hypertension in his brother, father, and mother; Stroke in his mother.    ROS:  Please see the   history of present illness.   Otherwise, review of systems is positive for none.   All other systems are reviewed and negative.    PHYSICAL EXAM: VS:  BP 122/78   Pulse (!) 58   Ht 6\' 2"  (1.88 m)   Wt 206 lb 6.4 oz (93.6 kg)   SpO2 97%   BMI 26.50 kg/m  , BMI Body mass index is 26.5 kg/m. GEN: Well nourished, well developed, in no acute distress  HEENT: normal  Neck: no JVD, carotid bruits, or masses Cardiac: RRR; no murmurs, rubs, or gallops,no edema  Respiratory:  clear to auscultation bilaterally, normal work of breathing GI: soft, nontender, nondistended, + BS MS: no deformity or atrophy  Skin: warm and dry Neuro:   Strength and sensation are intact Psych: euthymic mood, full affect  EKG:  EKG is ordered today. Personal review of the ekg ordered shows sinus rhythm, rate 58  Recent Labs: 05/30/2020: BUN 11; Creatinine, Ser 0.87; Magnesium 1.9; Platelets 235 06/03/2020: Hemoglobin 13.3; Hemoglobin 12.9; Potassium 3.9; Potassium 3.8; Sodium 141; Sodium 140    Lipid Panel  No results found for: CHOL, TRIG, HDL, CHOLHDL, VLDL, LDLCALC, LDLDIRECT   Wt Readings from Last 3 Encounters:  06/23/20 206 lb 6.4 oz (93.6 kg)  06/17/20 206 lb (93.4 kg)  06/03/20 206 lb (93.4 kg)      Other studies Reviewed: Additional studies/ records that were reviewed today include: [facility] 06/03/20  Review of the above records today demonstrates:   The left ventricular systolic function is normal. The left ventricular ejection fraction is 55-65% by visual estimate.  LV end diastolic pressure is normal.  Normal Right Heart Cath Numbers-Cardiac Output Index and Pressures.  Mid LAD lesion is 20% stenosed. Otherwise minimal CAD.  Cardiac monitor 06/16/2020 personally reviewed Mobitz 1 second-degree AV block with episodes of SVT    ASSESSMENT AND PLAN:  1. Mobitz 1 second-degree AV block: Has been having significant shortness of breath and fatigue. At this point, this could be caused by his Mobitz 1 heart block. He does have significant bradycardia associated with the symptoms. Due to that, he would benefit from pacemaker implant. Risks and benefits were discussed risk of bleeding, tamponade, infection, pneumothorax. The patient understands these risks and has agreed to the procedure.    Current medicines are reviewed at length with the patient today.   The patient does not have concerns regarding his medicines.  The following changes were made today:  none  Labs/ tests ordered today include:  Orders Placed This Encounter  Procedures  . Basic metabolic panel  . CBC  . EKG 12-Lead     Disposition:   FU with Jinan Biggins 3 months  Signed, Tawonda Legaspi 06/18/2020, MD  06/23/2020 10:37 AM     [facility] [address] [place] [phone] (office) [phone] (fax)

## 2020-07-02 ENCOUNTER — Other Ambulatory Visit (HOSPITAL_COMMUNITY)
Admission: RE | Admit: 2020-07-02 | Discharge: 2020-07-02 | Disposition: A | Discharge status: Home / Self Care | Payer: Medicare HMO | Source: Ambulatory Visit | Attending: Cardiology | Admitting: Cardiology

## 2020-07-02 DIAGNOSIS — Z01812 Encounter for preprocedural laboratory examination: Secondary | ICD-10-CM | POA: Insufficient documentation

## 2020-07-02 DIAGNOSIS — Z20822 Contact with and (suspected) exposure to covid-19: Secondary | ICD-10-CM | POA: Diagnosis not present

## 2020-07-02 LAB — SARS CORONAVIRUS 2 (TAT 6-24 HRS): SARS Coronavirus 2: NEGATIVE

## 2020-07-03 NOTE — Progress Notes (Signed)
Attempted to call the patient to go instructions for tomorrows procedure.  No answer.

## 2020-07-04 ENCOUNTER — Ambulatory Visit (HOSPITAL_COMMUNITY)
Admission: RE | Admit: 2020-07-04 | Discharge: 2020-07-04 | Disposition: A | Discharge status: Home / Self Care | Payer: Medicare HMO | Attending: Cardiology | Admitting: Cardiology

## 2020-07-04 ENCOUNTER — Ambulatory Visit (HOSPITAL_COMMUNITY): Payer: Medicare HMO

## 2020-07-04 ENCOUNTER — Other Ambulatory Visit: Payer: Self-pay

## 2020-07-04 ENCOUNTER — Ambulatory Visit (HOSPITAL_COMMUNITY)
Admission: RE | Disposition: A | Discharge status: Home / Self Care | Payer: Self-pay | Source: Home / Self Care | Attending: Cardiology

## 2020-07-04 DIAGNOSIS — E119 Type 2 diabetes mellitus without complications: Secondary | ICD-10-CM | POA: Diagnosis not present

## 2020-07-04 DIAGNOSIS — Z7982 Long term (current) use of aspirin: Secondary | ICD-10-CM | POA: Insufficient documentation

## 2020-07-04 DIAGNOSIS — Z79899 Other long term (current) drug therapy: Secondary | ICD-10-CM | POA: Insufficient documentation

## 2020-07-04 DIAGNOSIS — Z96652 Presence of left artificial knee joint: Secondary | ICD-10-CM | POA: Insufficient documentation

## 2020-07-04 DIAGNOSIS — Z7984 Long term (current) use of oral hypoglycemic drugs: Secondary | ICD-10-CM | POA: Insufficient documentation

## 2020-07-04 DIAGNOSIS — I441 Atrioventricular block, second degree: Secondary | ICD-10-CM | POA: Insufficient documentation

## 2020-07-04 DIAGNOSIS — Z833 Family history of diabetes mellitus: Secondary | ICD-10-CM | POA: Diagnosis not present

## 2020-07-04 DIAGNOSIS — Z87891 Personal history of nicotine dependence: Secondary | ICD-10-CM | POA: Insufficient documentation

## 2020-07-04 DIAGNOSIS — E785 Hyperlipidemia, unspecified: Secondary | ICD-10-CM | POA: Diagnosis not present

## 2020-07-04 DIAGNOSIS — Z95818 Presence of other cardiac implants and grafts: Secondary | ICD-10-CM

## 2020-07-04 DIAGNOSIS — Z8249 Family history of ischemic heart disease and other diseases of the circulatory system: Secondary | ICD-10-CM | POA: Insufficient documentation

## 2020-07-04 DIAGNOSIS — I442 Atrioventricular block, complete: Secondary | ICD-10-CM | POA: Diagnosis not present

## 2020-07-04 DIAGNOSIS — Z950 Presence of cardiac pacemaker: Secondary | ICD-10-CM | POA: Diagnosis not present

## 2020-07-04 HISTORY — PX: PACEMAKER IMPLANT: EP1218

## 2020-07-04 LAB — GLUCOSE, CAPILLARY: Glucose-Capillary: 122 mg/dL — ABNORMAL HIGH (ref 70–99)

## 2020-07-04 SURGERY — PACEMAKER IMPLANT

## 2020-07-04 MED ORDER — LIDOCAINE HCL (PF) 1 % IJ SOLN
INTRAMUSCULAR | Status: DC | PRN
  Administered 2020-07-04: 60 mL

## 2020-07-04 MED ORDER — CEFAZOLIN SODIUM-DEXTROSE 2-4 GM/100ML-% IV SOLN
2.0000 g | INTRAVENOUS | Status: AC
  Administered 2020-07-04: 2 g via INTRAVENOUS
  Filled 2020-07-04: qty 100

## 2020-07-04 MED ORDER — ACETAMINOPHEN 325 MG PO TABS
325.0000 mg | ORAL_TABLET | ORAL | Status: DC | PRN
  Filled 2020-07-04: qty 2

## 2020-07-04 MED ORDER — CEFAZOLIN SODIUM-DEXTROSE 2-4 GM/100ML-% IV SOLN
INTRAVENOUS | Status: AC
  Filled 2020-07-04: qty 100

## 2020-07-04 MED ORDER — ONDANSETRON HCL 4 MG/2ML IJ SOLN: 4.0000 mg | Freq: Four times a day (QID) | INTRAMUSCULAR | Status: DC | PRN

## 2020-07-04 MED ORDER — SODIUM CHLORIDE 0.9 % IV SOLN
80.0000 mg | INTRAVENOUS | Status: AC
  Administered 2020-07-04: 80 mg

## 2020-07-04 MED ORDER — SODIUM CHLORIDE 0.9 % IV SOLN
INTRAVENOUS | Status: AC
  Filled 2020-07-04: qty 2

## 2020-07-04 MED ORDER — HEPARIN (PORCINE) IN NACL 1000-0.9 UT/500ML-% IV SOLN
INTRAVENOUS | Status: AC
  Filled 2020-07-04: qty 500

## 2020-07-04 MED ORDER — CHLORHEXIDINE GLUCONATE 4 % EX LIQD: 4.0000 "application " | Freq: Once | CUTANEOUS | Status: DC

## 2020-07-04 MED ORDER — LIDOCAINE HCL 1 % IJ SOLN
INTRAMUSCULAR | Status: AC
  Filled 2020-07-04: qty 60

## 2020-07-04 MED ORDER — SODIUM CHLORIDE 0.9 % IV SOLN: INTRAVENOUS | Status: DC

## 2020-07-04 MED ORDER — CEFAZOLIN SODIUM-DEXTROSE 1-4 GM/50ML-% IV SOLN: 1.0000 g | Freq: Four times a day (QID) | INTRAVENOUS | Status: DC

## 2020-07-04 MED ORDER — HEPARIN (PORCINE) IN NACL 1000-0.9 UT/500ML-% IV SOLN
INTRAVENOUS | Status: DC | PRN
  Administered 2020-07-04: 500 mL

## 2020-07-04 SURGICAL SUPPLY — 9 items
CABLE SURGICAL S-101-97-12 (CABLE) ×4 IMPLANT
IPG PACE AZUR XT DR MRI W1DR01 (Pacemaker) ×1 IMPLANT
LEAD CAPSURE NOVUS 5076-52CM (Lead) ×2 IMPLANT
LEAD CAPSURE NOVUS 5076-58CM (Lead) ×2 IMPLANT
MAT PREVALON FULL STRYKER (MISCELLANEOUS) ×2 IMPLANT
PACE AZURE XT DR MRI W1DR01 (Pacemaker) ×2 IMPLANT
PAD PRO RADIOLUCENT 2001M-C (PAD) ×2 IMPLANT
SHEATH 7FR PRELUDE SNAP 13 (SHEATH) ×4 IMPLANT
TRAY PACEMAKER INSERTION (PACKS) ×2 IMPLANT

## 2020-07-04 NOTE — Progress Notes (Signed)
Dr Camnitz notified of cxr results and ok to d/c home 

## 2020-07-04 NOTE — Discharge Instructions (Signed)
    Supplemental Discharge Instructions for  Pacemaker/Defibrillator Patients  Tomorrow, 07/05/20, send in a device transmission  Activity No heavy lifting or vigorous activity with your left/right arm for 6 to 8 weeks.  Do not raise your left/right arm above your head for one week.  Gradually raise your affected arm as drawn below.             07/11/20                      07/12/20                    07/13/20                 07/14/20 __  NO DRIVING for  1 week   ; you may begin driving on   [address]  .  WOUND CARE - Keep the wound area clean and dry.  Do not get this area wet , no showers until cleared to at your wound check visit . - Tomorrow, 07/05/20, remove the arm sling - Tomorrow, 07/05/20 remove the outer plastic bandage.  Underneath the plastic bandage there are steri strips (paper tapes), DO NOT remove these. The tape/steri-strips on your wound will fall off; do not pull them off.  No bandage is needed on the site.  DO  NOT apply any creams, oils, or ointments to the wound area. - If you notice any drainage or discharge from the wound, any swelling or bruising at the site, or you develop a fever > 101? F after you are discharged home, call the office at once.  Special Instructions - You are still able to use cellular telephones; use the ear opposite the side where you have your pacemaker/defibrillator.  Avoid carrying your cellular phone near your device. - When traveling through airports, show security personnel your identification card to avoid being screened in the metal detectors.  Ask the security personnel to use the hand wand. - Avoid arc welding equipment, MRI testing (magnetic resonance imaging), TENS units (transcutaneous nerve stimulators).  Call the office for questions about other devices. - Avoid electrical appliances that are in poor condition or are not properly grounded. - Microwave ovens are safe to be near or to operate.

## 2020-07-04 NOTE — Interval H&P Note (Signed)
History and Physical Interval Note:  07/04/2020 11:50 AM  William Tate  has presented today for surgery, with the diagnosis of bradycardia.  The various methods of treatment have been discussed with the patient and family. After consideration of risks, benefits and other options for treatment, the patient has consented to  Procedure(s): PACEMAKER IMPLANT (N/A) as a surgical intervention.  The patient's history has been reviewed, patient examined, no change in status, stable for surgery.  I have reviewed the patient's chart and labs.  Questions were answered to the patient's satisfaction.     [employer]

## 2020-07-07 ENCOUNTER — Telehealth: Payer: Self-pay

## 2020-07-07 ENCOUNTER — Encounter (HOSPITAL_COMMUNITY): Payer: Self-pay | Admitting: Cardiology

## 2020-07-07 NOTE — Telephone Encounter (Signed)
-----   Message from Sheilah Pigeon, [place] sent at 07/04/2020  2:20 PM EST ----- Same day today  MDT pacer  [facility]

## 2020-07-07 NOTE — Telephone Encounter (Signed)
Follow-up after same day discharge: Implant date: 07/04/20 MD: Loman Brooklyn, MD Device: MDT PPM  Location: Left Chest   Wound check visit: 07/15/20 90 day MD follow-up: 10/20/20  Remote Transmission received:yes  Dressing removed: Yes by patient, no s/s of infection present.    Reviewed activity restrictions.

## 2020-07-08 MED FILL — Lidocaine HCl Local Inj 1%: INTRAMUSCULAR | Qty: 60 | Status: AC

## 2020-07-08 MED FILL — Heparin Sod (Porcine)-NaCl IV Soln 1000 Unit/500ML-0.9%: INTRAVENOUS | Qty: 500 | Status: AC

## 2020-07-15 ENCOUNTER — Ambulatory Visit (INDEPENDENT_AMBULATORY_CARE_PROVIDER_SITE_OTHER): Payer: Medicare HMO | Admitting: Emergency Medicine

## 2020-07-15 ENCOUNTER — Other Ambulatory Visit: Payer: Self-pay

## 2020-07-15 DIAGNOSIS — I441 Atrioventricular block, second degree: Secondary | ICD-10-CM | POA: Diagnosis not present

## 2020-07-15 LAB — CUP PACEART INCLINIC DEVICE CHECK
Brady Statistic RA Percent Paced: 0.9 %
Brady Statistic RV Percent Paced: 99.1 %
Date Time Interrogation Session: 20220315090349
Implantable Lead Implant Date: 20220304
Implantable Lead Implant Date: 20220304
Implantable Lead Location: 753859
Implantable Lead Location: 753860
Implantable Lead Model: 5076
Implantable Lead Model: 5076
Implantable Pulse Generator Implant Date: 20220304
Lead Channel Pacing Threshold Amplitude: 0.5 V
Lead Channel Pacing Threshold Amplitude: 0.75 V
Lead Channel Pacing Threshold Pulse Width: 0.4 ms
Lead Channel Pacing Threshold Pulse Width: 0.4 ms
Lead Channel Sensing Intrinsic Amplitude: 20 mV
Lead Channel Sensing Intrinsic Amplitude: 4.4 mV

## 2020-07-15 NOTE — Patient Instructions (Signed)
You can use an ice pack 15/20 minutes at a time to help with swelling. Please call if you have increased swelling, redness, warmth, pain fever or chills.   Device Clinic [phone]

## 2020-07-15 NOTE — Progress Notes (Signed)
Wound check appointment. Steri-strips removed. Wound without redness. Wound is small amount of swelling noted with eccyhmosis. Advised to use ice to site 15-20 minutes at a time. Wound recheck scheduled 07/22/20. Incision edges approximated, wound well healed. Normal device function. Thresholds, sensing, and impedances consistent with implant measurements. Device programmed at 3.5V/auto capture programmed on for extra safety margin until 3 month visit. Histogram distribution appropriate for patient and level of activity. No mode switches or high ventricular rates noted. Patient educated about wound care, arm mobility, lifting restrictions. ROV in 3 months with Dr. Elberta Fortis.

## 2020-07-22 ENCOUNTER — Ambulatory Visit (INDEPENDENT_AMBULATORY_CARE_PROVIDER_SITE_OTHER): Payer: Medicare HMO | Admitting: Emergency Medicine

## 2020-07-22 ENCOUNTER — Other Ambulatory Visit: Payer: Self-pay

## 2020-07-22 DIAGNOSIS — I442 Atrioventricular block, complete: Secondary | ICD-10-CM

## 2020-07-22 NOTE — Progress Notes (Signed)
Recheck of pacemaker wound site due to hematoma at last visit. Edema decreased from last visit. Bruising present at wound site. Patient reports decreased pain and improvement in wound site ove the past week. Dr Elberta Fortis in to assess wound site. Patient to call if edema increases or if he has any s/sx of infection at wound site. Education on s/sx of infection and patient and wife verbalized understanding.

## 2020-07-22 NOTE — Patient Instructions (Signed)
Call the office if you have increased swelling , or if you have any drainage , bleeding or redness at wound site. Call the office if you develop a fever or chills. Apply an ice pack to wound site for 20 minutes  At a time 3-4 times a day.  Device Clinic: [phone]

## 2020-08-27 DIAGNOSIS — F3341 Major depressive disorder, recurrent, in partial remission: Secondary | ICD-10-CM | POA: Diagnosis not present

## 2020-08-27 DIAGNOSIS — K219 Gastro-esophageal reflux disease without esophagitis: Secondary | ICD-10-CM | POA: Diagnosis not present

## 2020-08-27 DIAGNOSIS — E1169 Type 2 diabetes mellitus with other specified complication: Secondary | ICD-10-CM | POA: Diagnosis not present

## 2020-08-27 DIAGNOSIS — E785 Hyperlipidemia, unspecified: Secondary | ICD-10-CM | POA: Diagnosis not present

## 2020-08-27 DIAGNOSIS — F419 Anxiety disorder, unspecified: Secondary | ICD-10-CM | POA: Diagnosis not present

## 2020-08-27 DIAGNOSIS — I440 Atrioventricular block, first degree: Secondary | ICD-10-CM | POA: Diagnosis not present

## 2020-08-27 DIAGNOSIS — E559 Vitamin D deficiency, unspecified: Secondary | ICD-10-CM | POA: Diagnosis not present

## 2020-08-27 DIAGNOSIS — M159 Polyosteoarthritis, unspecified: Secondary | ICD-10-CM | POA: Diagnosis not present

## 2020-08-27 DIAGNOSIS — N200 Calculus of kidney: Secondary | ICD-10-CM | POA: Diagnosis not present

## 2020-09-16 ENCOUNTER — Other Ambulatory Visit: Payer: Self-pay

## 2020-09-16 ENCOUNTER — Encounter: Payer: Self-pay | Admitting: Cardiology

## 2020-09-16 ENCOUNTER — Ambulatory Visit: Payer: Medicare HMO | Admitting: Cardiology

## 2020-09-16 VITALS — BP 130/70 | HR 70 | Ht 74.0 in | Wt 207.0 lb

## 2020-09-16 DIAGNOSIS — F3341 Major depressive disorder, recurrent, in partial remission: Secondary | ICD-10-CM | POA: Diagnosis not present

## 2020-09-16 DIAGNOSIS — E1169 Type 2 diabetes mellitus with other specified complication: Secondary | ICD-10-CM | POA: Diagnosis not present

## 2020-09-16 DIAGNOSIS — F419 Anxiety disorder, unspecified: Secondary | ICD-10-CM | POA: Diagnosis not present

## 2020-09-16 DIAGNOSIS — E7800 Pure hypercholesterolemia, unspecified: Secondary | ICD-10-CM | POA: Diagnosis not present

## 2020-09-16 DIAGNOSIS — E1100 Type 2 diabetes mellitus with hyperosmolarity without nonketotic hyperglycemic-hyperosmolar coma (NKHHC): Secondary | ICD-10-CM

## 2020-09-16 DIAGNOSIS — E559 Vitamin D deficiency, unspecified: Secondary | ICD-10-CM | POA: Diagnosis not present

## 2020-09-16 DIAGNOSIS — I441 Atrioventricular block, second degree: Secondary | ICD-10-CM | POA: Diagnosis not present

## 2020-09-16 DIAGNOSIS — N200 Calculus of kidney: Secondary | ICD-10-CM | POA: Diagnosis not present

## 2020-09-16 DIAGNOSIS — Z950 Presence of cardiac pacemaker: Secondary | ICD-10-CM

## 2020-09-16 DIAGNOSIS — M159 Polyosteoarthritis, unspecified: Secondary | ICD-10-CM | POA: Diagnosis not present

## 2020-09-16 DIAGNOSIS — E785 Hyperlipidemia, unspecified: Secondary | ICD-10-CM | POA: Diagnosis not present

## 2020-09-16 DIAGNOSIS — I440 Atrioventricular block, first degree: Secondary | ICD-10-CM | POA: Diagnosis not present

## 2020-09-16 DIAGNOSIS — K219 Gastro-esophageal reflux disease without esophagitis: Secondary | ICD-10-CM | POA: Diagnosis not present

## 2020-09-16 NOTE — Patient Instructions (Signed)
Medication Instructions:  Your physician recommends that you continue on your current medications as directed. Please refer to the Current Medication list given to you today.  *If you need a refill on your cardiac medications before your next appointment, please call your pharmacy*   Lab Work: Your physician recommends that you return for lab work: TODAY: Lipids If you have labs (blood work) drawn today and your tests are completely normal, you will receive your results only by: Marland Kitchen MyChart Message (if you have MyChart) OR . A paper copy in the mail If you have any lab test that is abnormal or we need to change your treatment, we will call you to review the results.   Testing/Procedures: None   Follow-Up: At [facility], you and your health needs are our priority.  As part of our continuing mission to provide you with exceptional heart care, we have created designated Provider Care Teams.  These Care Teams include your primary Cardiologist (physician) and Advanced Practice Providers (APPs -  Physician Assistants and Nurse Practitioners) who all work together to provide you with the care you need, when you need it.  We recommend signing up for the patient portal called "MyChart".  Sign up information is provided on this After Visit Summary.  MyChart is used to connect with patients for Virtual Visits (Telemedicine).  Patients are able to view lab/test results, encounter notes, upcoming appointments, etc.  Non-urgent messages can be sent to your provider as well.   To learn more about what you can do with MyChart, go to [email or web address].    Your next appointment:   6 month(s)  The format for your next appointment:   In Person  Provider:   Thomasene Ripple, DO   Other Instructions

## 2020-09-16 NOTE — Progress Notes (Signed)
Cardiology Office Note:    Date:  09/16/2020   ID:  William Tate, DOB [birth year], MRN [id number]  PCP:  Cher Nakai, MD  Cardiologist:  Berniece Salines, DO  Electrophysiologist:  Constance Haw, MD   Referring MD: Cher Nakai, MD   Chief Complaint  Patient presents with  . Follow-up    History of Present Illness:    William Tate is a 70 y.o. male with a hx of diabetes mellitus, hyperlipidemia, status Medtronic pacemaker implantation, mild coronary artery disease is here today for follow-up visit.  At his last visit which was in June 17, 2020 we talked about his right left heart catheterization as well as the result of his ZIO monitor.  His ZIO monitor showed secondary AV block with sinus pauses 3.5 and the patient was recommended to EP for pacemaker implantation.  Since his pacemaker he has been doing well.  The most important complaint for the patient's and his wife today is the fact that he is getting significantly anxious.  He was started on citalopram by his primary care doctor but this seems to not be helping according to the patient's wife.  She mentioned transient episode where he had 1 day of swollen ankles which resolved.     Past Medical History:  Diagnosis Date  . GERD (gastroesophageal reflux disease)   . Hyperlipidemia   . Type 2 diabetes mellitus ([place])     Past Surgical History:  Procedure Laterality Date  . PACEMAKER IMPLANT N/A 07/04/2020   Procedure: PACEMAKER IMPLANT;  Surgeon: Constance Haw, MD;  Location: [facility];  Service: Cardiovascular;  Laterality: N/A;  . REPLACEMENT TOTAL KNEE Left   . RIGHT/LEFT HEART CATH AND CORONARY ANGIOGRAPHY N/A 06/03/2020   Procedure: RIGHT/LEFT HEART CATH AND CORONARY ANGIOGRAPHY;  Surgeon: Leonie Man, MD;  Location: [facility];  Service: Cardiovascular;  Laterality: N/A;    Current Medications: Current Meds  Medication Sig  . aspirin 81 MG EC tablet Take 81 mg by mouth daily. Swallow  whole.  Marland Kitchen atorvastatin (LIPITOR) 10 MG tablet Take 10 mg by mouth daily.  . Cholecalciferol (VITAMIN D3) 25 MCG (1000 UT) CAPS Take 1 Units by mouth daily.  . citalopram (CELEXA) 10 MG tablet Take 10 mg by mouth daily.  Marland Kitchen glimepiride (AMARYL) 4 MG tablet Take 2 mg by mouth daily with lunch.  . ibuprofen (ADVIL) 200 MG tablet Take 800 mg by mouth every 8 (eight) hours as needed (for pain).  . metFORMIN (GLUCOPHAGE) 850 MG tablet Take 850 mg by mouth in the morning and at bedtime.  Marland Kitchen omeprazole (PRILOSEC) 20 MG capsule Take 20 mg by mouth daily.  . vitamin C (ASCORBIC ACID) 500 MG tablet Take 500 mg by mouth daily.     Allergies:   Patient has no known allergies.   Social History   Socioeconomic History  . Marital status: Married    Spouse name: Not on file  . Number of children: Not on file  . Years of education: Not on file  . Highest education level: Not on file  Occupational History  . Not on file  Tobacco Use  . Smoking status: Former Research scientist (life sciences)  . Smokeless tobacco: Never Used  Vaping Use  . Vaping Use: Never used  Substance and Sexual Activity  . Alcohol use: Never  . Drug use: Never  . Sexual activity: Not on file  Other Topics Concern  . Not on file  Social History Narrative  .  Not on file   Social Determinants of Health   Financial Resource Strain: Not on file  Food Insecurity: Not on file  Transportation Needs: Not on file  Physical Activity: Not on file  Stress: Not on file  Social Connections: Not on file     Family History: The patient's family history includes Diabetes in his brother and maternal grandmother; Heart Problems in his sister; Heart disease in his brother; Hypertension in his brother, father, and mother; Stroke in his mother.  ROS:   Review of Systems  Constitution: Negative for decreased appetite, fever and weight gain.  HENT: Negative for congestion, ear discharge, hoarse voice and sore throat.   Eyes: Negative for discharge, redness,  vision loss in right eye and visual halos.  Cardiovascular: Negative for chest pain, dyspnea on exertion, leg swelling, orthopnea and palpitations.  Respiratory: Negative for cough, hemoptysis, shortness of breath and snoring.   Endocrine: Negative for heat intolerance and polyphagia.  Hematologic/Lymphatic: Negative for bleeding problem. Does not bruise/bleed easily.  Skin: Negative for flushing, nail changes, rash and suspicious lesions.  Musculoskeletal: Negative for arthritis, joint pain, muscle cramps, myalgias, neck pain and stiffness.  Gastrointestinal: Negative for abdominal pain, bowel incontinence, diarrhea and excessive appetite.  Genitourinary: Negative for decreased libido, genital sores and incomplete emptying.  Neurological: Negative for brief paralysis, focal weakness, headaches and loss of balance.  Psychiatric/Behavioral: Negative for altered mental status, depression and suicidal ideas.  Allergic/Immunologic: Negative for HIV exposure and persistent infections.    EKGs/Labs/Other Studies Reviewed:    The following studies were reviewed today:   EKG: None today  Left heart catheterization June 03, 2020.  The left ventricular systolic function is normal. The left ventricular ejection fraction is 55-65% by visual estimate.  LV end diastolic pressure is normal.  Normal Right Heart Cath Numbers-Cardiac Output Index and Pressures.  Mid LAD lesion is 20% stenosed. Otherwise minimal CAD.   SUMMARY  Angiographically minimal CAD  Normal Right Heart Cath numbers with normal PCWP and LVEDP.   RECOMMENDATIONS  Consider nonanginal/CAD related cause for his dyspnea.  ZIO monitor ZIO monitor Patch Wear Time: 12 days and 5 hours (2022-01-19T15:22:36-0500 to 2022-01-31T20:42:36-0500)  Patient had a min HR of 22 bpm, max HR of 140 bpm, and avg HR of 55 bpm. Predominant underlying rhythm was Sinus Rhythm. First Degree AV Block was present. 1 run of Ventricular  Tachycardia occurred lasting 4 beats with a max rate of 140 bpm (avg 125  bpm). 1 Pause occurred lasting 3.5 secs (17 bpm). 1085 episode(s) of AV Block (2nd Mobitz II) occurred, lasting a total of 11 hours 54 mins. Second Degree AV Block-Mobitz I (Wenckebach) was present. AV Block (2nd Mobitz II) and Wenckebach were  detected within +/- 45 seconds of symptomatic patient event(s). Isolated SVEs were occasional (1.8%, 16739), SVE Couplets were rare (<1.0%, 1581), and no SVE Triplets were present. Isolated VEs were rare (<1.0%), VE Couplets were rare (<1.0%), and no VE  Triplets were present. MD notification criteria for Second Degree AV Block-Mobitz II and Symptomatic Bradycardia met - notified Garwin Brothers, RN on 12 Jun 2020 at 3:15 pm CT (KT).  ZIO monitor was performed for 13 days to evaluate bradycardia. Rhythm throughout was sinus average minimum maximal heart rates of 57, 39 and 138 bpm. First-degree AV block was seen. There were frequent episodes of Mobitz 1 second-degree AV block rarely 2-1 which occurred an effective rate of 35 bpm at 4:59 AM. There is one single pause of 3.5 seconds  noticed. There are no episodes of Mobitz 2 second-degree AV block or sinus node exit block. At higher heart rates with sinus tachycardia there is generally one-to-one conduction. Ventricular ectopy is rare with PVCs and couplets and 1 4 beat run of PVCs.. Supraventricular ectopy was occasional. There were no episodes of atrial fibrillation or flutter. There were 42 triggered and 50 diary events overwhelmingly associated with Mobitz 1 second-degree AV block.   Conclusion Mobitz 1 second-degree AV block. Recent Labs: 05/30/2020: Magnesium 1.9 06/23/2020: BUN 16; Creatinine, Ser 0.94; Hemoglobin 14.3; Platelets 226; Potassium 4.4; Sodium 142  Recent Lipid Panel No results found for: CHOL, TRIG, HDL, CHOLHDL, VLDL, LDLCALC, LDLDIRECT  Physical Exam:    VS:  BP 130/70 (BP Location: Left Arm, Patient  Position: Sitting, Cuff Size: Normal)   Pulse 70   Ht $R'6\' 2"'CP$  (1.88 m)   Wt 207 lb (93.9 kg)   SpO2 97%   BMI 26.58 kg/m     Wt Readings from Last 3 Encounters:  09/16/20 207 lb (93.9 kg)  07/04/20 208 lb (94.3 kg)  06/23/20 206 lb 6.4 oz (93.6 kg)     GEN: Well nourished, well developed in no acute distress HEENT: Normal NECK: No JVD; No carotid bruits LYMPHATICS: No lymphadenopathy CARDIAC: S1S2 noted,RRR, no murmurs, rubs, gallops RESPIRATORY:  Clear to auscultation without rales, wheezing or rhonchi  ABDOMEN: Soft, non-tender, non-distended, +bowel sounds, no guarding. EXTREMITIES: No edema, No cyanosis, no clubbing MUSCULOSKELETAL:  No deformity  SKIN: Warm and dry NEUROLOGIC:  Alert and oriented x 3, non-focal PSYCHIATRIC:  Normal affect, good insight  ASSESSMENT:    1. Hyperlipidemia, unspecified hyperlipidemia type   2. Type 2 diabetes mellitus with hyperosmolarity without coma, without long-term current use of insulin ([place])   3. Second degree AV block   4. Status post placement of cardiac pacemaker    PLAN:     1.  He appears to be doing well from a cardiovascular standpoint.  No angina symptoms.  Reviewed his most recent device check which was on July 15, 2020, he appeared that his battery and lead parameters were stable.  2.  Hyperlipidemia - continue with current statin medication.  3.  This is being managed by his primary care doctor.  No adjustments for antidiabetic medications were made today.  4.  I have encouraged the patient and his wife to follow-up with his primary care doctor for optimization of his  The patient is in agreement with the above plan. The patient left the office in stable condition.  The patient will follow up in 6 months or sooner if needed.   Medication Adjustments/Labs and Tests Ordered: Current medicines are reviewed at length with the patient today.  Concerns regarding medicines are outlined above.  Orders Placed This Encounter   Procedures  . Lipid panel   No orders of the defined types were placed in this encounter.   Patient Instructions  Medication Instructions:  Your physician recommends that you continue on your current medications as directed. Please refer to the Current Medication list given to you today.  *If you need a refill on your cardiac medications before your next appointment, please call your pharmacy*   Lab Work: Your physician recommends that you return for lab work: TODAY: Lipids If you have labs (blood work) drawn today and your tests are completely normal, you will receive your results only by: Marland Kitchen MyChart Message (if you have MyChart) OR . A paper copy in the mail If you have any  lab test that is abnormal or we need to change your treatment, we will call you to review the results.   Testing/Procedures: None   Follow-Up: At [facility], you and your health needs are our priority.  As part of our continuing mission to provide you with exceptional heart care, we have created designated Provider Care Teams.  These Care Teams include your primary Cardiologist (physician) and Advanced Practice Providers (APPs -  Physician Assistants and Nurse Practitioners) who all work together to provide you with the care you need, when you need it.  We recommend signing up for the patient portal called "MyChart".  Sign up information is provided on this After Visit Summary.  MyChart is used to connect with patients for Virtual Visits (Telemedicine).  Patients are able to view lab/test results, encounter notes, upcoming appointments, etc.  Non-urgent messages can be sent to your provider as well.   To learn more about what you can do with MyChart, go to [email or web address].    Your next appointment:   6 month(s)  The format for your next appointment:   In Person  Provider:   Berniece Salines, DO   Other Instructions      Adopting a Healthy Lifestyle.  Know what a healthy weight is for you  (roughly BMI <25) and aim to maintain this   Aim for 7+ servings of fruits and vegetables daily   65-80+ fluid ounces of water or unsweet tea for healthy kidneys   Limit to max 1 drink of alcohol per day; avoid smoking/tobacco   Limit animal fats in diet for cholesterol and heart health - choose grass fed whenever available   Avoid highly processed foods, and foods high in saturated/trans fats   Aim for low stress - take time to unwind and care for your mental health   Aim for 150 min of moderate intensity exercise weekly for heart health, and weights twice weekly for bone health   Aim for 7-9 hours of sleep daily   When it comes to diets, agreement about the perfect plan isnt easy to find, even among the experts. Experts at the [place] developed an idea known as the Healthy Eating Plate. Just imagine a plate divided into logical, healthy portions.   The emphasis is on diet quality:   Load up on vegetables and fruits - one-half of your plate: Aim for color and variety, and remember that potatoes dont count.   Go for whole grains - one-quarter of your plate: Whole wheat, barley, wheat berries, quinoa, oats, brown rice, and foods made with them. If you want pasta, go with whole wheat pasta.   Protein power - one-quarter of your plate: Fish, chicken, beans, and nuts are all healthy, versatile protein sources. Limit red meat.   The diet, however, does go beyond the plate, offering a few other suggestions.   Use healthy plant oils, such as olive, canola, soy, corn, sunflower and peanut. Check the labels, and avoid partially hydrogenated oil, which have unhealthy trans fats.   If youre thirsty, drink water. Coffee and tea are good in moderation, but skip sugary drinks and limit milk and dairy products to one or two daily servings.   The type of carbohydrate in the diet is more important than the amount. Some sources of carbohydrates, such as vegetables, fruits,  whole grains, and beans-are healthier than others.   Finally, stay active  Signed, Berniece Salines, DO  09/16/2020 8:49 AM    [facility]

## 2020-09-17 DIAGNOSIS — E785 Hyperlipidemia, unspecified: Secondary | ICD-10-CM | POA: Diagnosis not present

## 2020-09-17 LAB — LIPID PANEL
Chol/HDL Ratio: 2.3 ratio (ref 0.0–5.0)
Cholesterol, Total: 149 mg/dL (ref 100–199)
HDL: 64 mg/dL (ref >39)
LDL Chol Calc (NIH): 73 mg/dL (ref 0–99)
Triglycerides: 56 mg/dL (ref 0–149)
VLDL Cholesterol Cal: 12 mg/dL (ref 5–40)

## 2020-10-01 DIAGNOSIS — I440 Atrioventricular block, first degree: Secondary | ICD-10-CM | POA: Diagnosis not present

## 2020-10-01 DIAGNOSIS — K219 Gastro-esophageal reflux disease without esophagitis: Secondary | ICD-10-CM | POA: Diagnosis not present

## 2020-10-01 DIAGNOSIS — E1169 Type 2 diabetes mellitus with other specified complication: Secondary | ICD-10-CM | POA: Diagnosis not present

## 2020-10-01 DIAGNOSIS — F3341 Major depressive disorder, recurrent, in partial remission: Secondary | ICD-10-CM | POA: Diagnosis not present

## 2020-10-01 DIAGNOSIS — N200 Calculus of kidney: Secondary | ICD-10-CM | POA: Diagnosis not present

## 2020-10-01 DIAGNOSIS — F419 Anxiety disorder, unspecified: Secondary | ICD-10-CM | POA: Diagnosis not present

## 2020-10-01 DIAGNOSIS — E7800 Pure hypercholesterolemia, unspecified: Secondary | ICD-10-CM | POA: Diagnosis not present

## 2020-10-01 DIAGNOSIS — E559 Vitamin D deficiency, unspecified: Secondary | ICD-10-CM | POA: Diagnosis not present

## 2020-10-01 DIAGNOSIS — M159 Polyosteoarthritis, unspecified: Secondary | ICD-10-CM | POA: Diagnosis not present

## 2020-10-03 ENCOUNTER — Ambulatory Visit (INDEPENDENT_AMBULATORY_CARE_PROVIDER_SITE_OTHER): Payer: Medicare HMO

## 2020-10-03 DIAGNOSIS — I441 Atrioventricular block, second degree: Secondary | ICD-10-CM | POA: Diagnosis not present

## 2020-10-03 LAB — CUP PACEART REMOTE DEVICE CHECK
Battery Remaining Longevity: 144 mo
Battery Voltage: 3.19 V
Brady Statistic AP VP Percent: 2.99 %
Brady Statistic AP VS Percent: 0.1 %
Brady Statistic AS VP Percent: 94.58 %
Brady Statistic AS VS Percent: 2.33 %
Brady Statistic RA Percent Paced: 3.06 %
Brady Statistic RV Percent Paced: 97.57 %
Date Time Interrogation Session: 20220603043245
Implantable Lead Implant Date: 20220304
Implantable Lead Implant Date: 20220304
Implantable Lead Location: 753859
Implantable Lead Location: 753860
Implantable Lead Model: 5076
Implantable Lead Model: 5076
Implantable Pulse Generator Implant Date: 20220304
Lead Channel Impedance Value: 285 Ohm
Lead Channel Impedance Value: 399 Ohm
Lead Channel Impedance Value: 456 Ohm
Lead Channel Impedance Value: 570 Ohm
Lead Channel Pacing Threshold Amplitude: 0.625 V
Lead Channel Pacing Threshold Amplitude: 0.625 V
Lead Channel Pacing Threshold Pulse Width: 0.4 ms
Lead Channel Pacing Threshold Pulse Width: 0.4 ms
Lead Channel Sensing Intrinsic Amplitude: 2.875 mV
Lead Channel Sensing Intrinsic Amplitude: 2.875 mV
Lead Channel Sensing Intrinsic Amplitude: 20.125 mV
Lead Channel Sensing Intrinsic Amplitude: 20.125 mV
Lead Channel Setting Pacing Amplitude: 3.5 V
Lead Channel Setting Pacing Amplitude: 3.5 V
Lead Channel Setting Pacing Pulse Width: 0.4 ms
Lead Channel Setting Sensing Sensitivity: 1.2 mV

## 2020-10-20 ENCOUNTER — Ambulatory Visit (INDEPENDENT_AMBULATORY_CARE_PROVIDER_SITE_OTHER): Payer: Medicare HMO | Admitting: Cardiology

## 2020-10-20 ENCOUNTER — Other Ambulatory Visit: Payer: Self-pay

## 2020-10-20 ENCOUNTER — Encounter: Payer: Self-pay | Admitting: Cardiology

## 2020-10-20 VITALS — BP 142/78 | HR 62 | Ht 74.0 in | Wt 205.0 lb

## 2020-10-20 DIAGNOSIS — R0602 Shortness of breath: Secondary | ICD-10-CM | POA: Diagnosis not present

## 2020-10-20 DIAGNOSIS — I441 Atrioventricular block, second degree: Secondary | ICD-10-CM | POA: Diagnosis not present

## 2020-10-20 NOTE — Progress Notes (Signed)
Electrophysiology Office Note   Date:  10/20/2020   ID:  BRUCE CHURILLA, DOB 05-Aug-1950, MRN [id number]  PCP:  Simone Curia, MD  Cardiologist:  [place] Primary Electrophysiologist:  Blimie Vaness Jorja Loa, MD    Chief Complaint: fatigue   History of Present Illness: William Tate is a 70 y.o. male who is being seen today for the evaluation of second degree AV block at the request of Simone Curia, MD. Presenting today for electrophysiology evaluation.  He has a history significant for hyperlipidemia and type 2 diabetes.  Left heart catheterization showed mild nonobstructive coronary artery disease.  He developed worsening shortness of breath and fatigue.  He wore a cardiac monitor that showed Mobitz 1 AV block with associated symptoms.  He also feels palpitations at times.  He checks his heart rates at home and is noted heart rates in the 140s as well as heart rates in the 20s and 30s.  He has now status post Medtronic dual-chamber pacemaker implanted 07/04/2020.  Today, denies symptoms of palpitations, chest pain,  orthopnea, PND, lower extremity edema, claudication, dizziness, presyncope, syncope, bleeding, or neurologic sequela. The patient is tolerating medications without difficulties.  Since his pacemaker was implanted he has done well.  He has no chest pain.  He has more energy.  He does continue to have episodes of shortness of breath.  He was initially seen by his primary cardiologist to follow this could potentially be due to anxiety.  His anxiety medications were adjusted.  He continues to have mild shortness of breath.   Past Medical History:  Diagnosis Date   GERD (gastroesophageal reflux disease)    Hyperlipidemia    Type 2 diabetes mellitus ([facility])    Past Surgical History:  Procedure Laterality Date   PACEMAKER IMPLANT N/A 07/04/2020   Procedure: PACEMAKER IMPLANT;  Surgeon: Regan Lemming, MD;  Location: [facility];  Service: Cardiovascular;  Laterality: N/A;    REPLACEMENT TOTAL KNEE Left    RIGHT/LEFT HEART CATH AND CORONARY ANGIOGRAPHY N/A 06/03/2020   Procedure: RIGHT/LEFT HEART CATH AND CORONARY ANGIOGRAPHY;  Surgeon: Marykay Lex, MD;  Location: [facility];  Service: Cardiovascular;  Laterality: N/A;     Current Outpatient Medications  Medication Sig Dispense Refill   aspirin 81 MG EC tablet Take 81 mg by mouth daily. Swallow whole.     atorvastatin (LIPITOR) 10 MG tablet Take 10 mg by mouth daily.     Cholecalciferol (VITAMIN D3) 25 MCG (1000 UT) CAPS Take 1 Units by mouth daily.     citalopram (CELEXA) 10 MG tablet Take 10 mg by mouth daily.     glimepiride (AMARYL) 4 MG tablet Take 2 mg by mouth daily with lunch.     ibuprofen (ADVIL) 200 MG tablet Take 800 mg by mouth every 8 (eight) hours as needed (for pain).     metFORMIN (GLUCOPHAGE) 850 MG tablet Take 850 mg by mouth in the morning and at bedtime.     omeprazole (PRILOSEC) 20 MG capsule Take 20 mg by mouth daily.     vitamin C (ASCORBIC ACID) 500 MG tablet Take 500 mg by mouth daily.     nitroGLYCERIN (NITROSTAT) 0.4 MG SL tablet Place 1 tablet (0.4 mg total) under the tongue every 5 (five) minutes as needed for chest pain. 90 tablet 3   No current facility-administered medications for this visit.    Allergies:   Patient has no allergy information on record.   Social History:  The patient  reports that he has quit smoking. He has never used smokeless tobacco. He reports that he does not drink alcohol and does not use drugs.   Family History:  The patient's family history includes Diabetes in his brother and maternal grandmother; Heart Problems in his sister; Heart disease in his brother; Hypertension in his brother, father, and mother; Stroke in his mother.   ROS:  Please see the history of present illness.   Otherwise, review of systems is positive for none.   All other systems are reviewed and negative.   PHYSICAL EXAM: VS:  BP (!) 142/78   Pulse 62   Ht 6\' 2"   (1.88 m)   Wt 205 lb (93 kg)   BMI 26.32 kg/m  , BMI Body mass index is 26.32 kg/m. GEN: Well nourished, well developed, in no acute distress  HEENT: normal  Neck: no JVD, carotid bruits, or masses Cardiac: RRR; no murmurs, rubs, or gallops,no edema  Respiratory:  clear to auscultation bilaterally, normal work of breathing GI: soft, nontender, nondistended, + BS MS: no deformity or atrophy  Skin: warm and dry, device site well healed Neuro:  Strength and sensation are intact Psych: euthymic mood, full affect  EKG:  EKG is ordered today. Personal review of the ekg ordered shows atrial sensed, ventricular paced  Personal review of the device interrogation today. Results in Paceart   Recent Labs: 05/30/2020: Magnesium 1.9 06/23/2020: BUN 16; Creatinine, Ser 0.94; Hemoglobin 14.3; Platelets 226; Potassium 4.4; Sodium 142    Lipid Panel     Component Value Date/Time   CHOL 149 09/17/2020 0811   TRIG 56 09/17/2020 0811   HDL 64 09/17/2020 0811   CHOLHDL 2.3 09/17/2020 0811   LDLCALC 73 09/17/2020 0811     Wt Readings from Last 3 Encounters:  10/20/20 205 lb (93 kg)  09/16/20 207 lb (93.9 kg)  07/04/20 208 lb (94.3 kg)      Other studies Reviewed: Additional studies/ records that were reviewed today include: LHC 06/03/20  Review of the above records today demonstrates:  The left ventricular systolic function is normal. The left ventricular ejection fraction is 55-65% by visual estimate. LV end diastolic pressure is normal. Normal Right Heart Cath Numbers-Cardiac Output Index and Pressures. Mid LAD lesion is 20% stenosed. Otherwise minimal CAD.  Cardiac monitor 06/16/2020 personally reviewed Mobitz 1 second-degree AV block with episodes of SVT    ASSESSMENT AND PLAN:  1.  Mobitz 1 second-degree AV block: Was having symptoms of shortness of breath and fatigue.  Is now status post Medtronic dual-chamber pacemaker implanted 07/04/2020.  Device functioning appropriately.  No  changes at this time.    2.  Shortness of breath: Patient does not have evidence of volume overload.  He has not gained weight.  Despite that, he continues to be short of breath.  This was initially thought to be due to anxiety.  His medications have been adjusted with mild shortness of breath.  We Aspynn Clover check a BNP today.    Current medicines are reviewed at length with the patient today.   The patient does not have concerns regarding his medicines.  The following changes were made today:  none  Labs/ tests ordered today include:  Orders Placed This Encounter  Procedures   Pro b natriuretic peptide (BNP)   EKG 12-Lead      Disposition:   FU with Dontavia Brand 3 months  Signed, Shayley Medlin 09/03/2020, MD  10/20/2020 11:20 AM  [place] [address] [phone] (office) [phone] (fax)

## 2020-10-20 NOTE — Patient Instructions (Signed)
Medication Instructions:  Your physician recommends that you continue on your current medications as directed. Please refer to the Current Medication list given to you today.  *If you need a refill on your cardiac medications before your next appointment, please call your pharmacy*   Lab Work: Today: BNP If you have labs (blood work) drawn today and your tests are completely normal, you will receive your results only by: MyChart Message (if you have MyChart) OR A paper copy in the mail If you have any lab test that is abnormal or we need to change your treatment, we will call you to review the results.   Testing/Procedures: None ordered   Follow-Up: At [facility], you and your health needs are our priority.  As part of our continuing mission to provide you with exceptional heart care, we have created designated Provider Care Teams.  These Care Teams include your primary Cardiologist (physician) and Advanced Practice Providers (APPs -  Physician Assistants and Nurse Practitioners) who all work together to provide you with the care you need, when you need it.  Remote monitoring is used to monitor your Pacemaker or ICD from home. This monitoring reduces the number of office visits required to check your device to one time per year. It allows [place] to keep an eye on the functioning of your device to ensure it is working properly. You are scheduled for a device check from home on 01/02/2021. You may send your transmission at any time that day. If you have a wireless device, the transmission will be sent automatically. After your physician reviews your transmission, you will receive a postcard with your next transmission date.  Your next appointment:   9 month(s)  The format for your next appointment:   In Person  Provider:   Loman Brooklyn, MD   Thank you for choosing [facility]!!   Dory Horn, RN [phone]

## 2020-10-21 LAB — PRO B NATRIURETIC PEPTIDE: NT-Pro BNP: 135 pg/mL (ref 0–376)

## 2020-10-21 NOTE — Progress Notes (Signed)
Remote pacemaker transmission.   

## 2020-11-27 DIAGNOSIS — K219 Gastro-esophageal reflux disease without esophagitis: Secondary | ICD-10-CM | POA: Diagnosis not present

## 2020-11-27 DIAGNOSIS — F419 Anxiety disorder, unspecified: Secondary | ICD-10-CM | POA: Diagnosis not present

## 2020-11-27 DIAGNOSIS — M159 Polyosteoarthritis, unspecified: Secondary | ICD-10-CM | POA: Diagnosis not present

## 2020-11-27 DIAGNOSIS — N200 Calculus of kidney: Secondary | ICD-10-CM | POA: Diagnosis not present

## 2020-11-27 DIAGNOSIS — F3341 Major depressive disorder, recurrent, in partial remission: Secondary | ICD-10-CM | POA: Diagnosis not present

## 2020-11-27 DIAGNOSIS — E1169 Type 2 diabetes mellitus with other specified complication: Secondary | ICD-10-CM | POA: Diagnosis not present

## 2020-11-27 DIAGNOSIS — I440 Atrioventricular block, first degree: Secondary | ICD-10-CM | POA: Diagnosis not present

## 2020-11-27 DIAGNOSIS — E559 Vitamin D deficiency, unspecified: Secondary | ICD-10-CM | POA: Diagnosis not present

## 2020-11-27 DIAGNOSIS — E7800 Pure hypercholesterolemia, unspecified: Secondary | ICD-10-CM | POA: Diagnosis not present

## 2021-01-02 ENCOUNTER — Ambulatory Visit (INDEPENDENT_AMBULATORY_CARE_PROVIDER_SITE_OTHER): Payer: Medicare HMO

## 2021-01-02 DIAGNOSIS — I441 Atrioventricular block, second degree: Secondary | ICD-10-CM | POA: Diagnosis not present

## 2021-01-06 LAB — CUP PACEART REMOTE DEVICE CHECK
Battery Remaining Longevity: 142 mo
Battery Voltage: 3.15 V
Brady Statistic AP VP Percent: 6.27 %
Brady Statistic AP VS Percent: 0.1 %
Brady Statistic AS VP Percent: 91.03 %
Brady Statistic AS VS Percent: 2.6 %
Brady Statistic RA Percent Paced: 6.39 %
Brady Statistic RV Percent Paced: 97.3 %
Date Time Interrogation Session: 20220902041218
Implantable Lead Implant Date: 20220304
Implantable Lead Implant Date: 20220304
Implantable Lead Location: 753859
Implantable Lead Location: 753860
Implantable Lead Model: 5076
Implantable Lead Model: 5076
Implantable Pulse Generator Implant Date: 20220304
Lead Channel Impedance Value: 285 Ohm
Lead Channel Impedance Value: 361 Ohm
Lead Channel Impedance Value: 456 Ohm
Lead Channel Impedance Value: 570 Ohm
Lead Channel Pacing Threshold Amplitude: 0.5 V
Lead Channel Pacing Threshold Amplitude: 0.625 V
Lead Channel Pacing Threshold Pulse Width: 0.4 ms
Lead Channel Pacing Threshold Pulse Width: 0.4 ms
Lead Channel Sensing Intrinsic Amplitude: 14.375 mV
Lead Channel Sensing Intrinsic Amplitude: 14.375 mV
Lead Channel Sensing Intrinsic Amplitude: 2.75 mV
Lead Channel Sensing Intrinsic Amplitude: 2.75 mV
Lead Channel Setting Pacing Amplitude: 2 V
Lead Channel Setting Pacing Amplitude: 2.5 V
Lead Channel Setting Pacing Pulse Width: 0.4 ms
Lead Channel Setting Sensing Sensitivity: 1.2 mV

## 2021-01-14 NOTE — Progress Notes (Signed)
Remote pacemaker transmission.   

## 2021-02-02 DIAGNOSIS — E113293 Type 2 diabetes mellitus with mild nonproliferative diabetic retinopathy without macular edema, bilateral: Secondary | ICD-10-CM | POA: Diagnosis not present

## 2021-02-02 DIAGNOSIS — H25013 Cortical age-related cataract, bilateral: Secondary | ICD-10-CM | POA: Diagnosis not present

## 2021-02-02 DIAGNOSIS — H5203 Hypermetropia, bilateral: Secondary | ICD-10-CM | POA: Diagnosis not present

## 2021-02-27 DIAGNOSIS — E785 Hyperlipidemia, unspecified: Secondary | ICD-10-CM | POA: Diagnosis not present

## 2021-02-27 DIAGNOSIS — E7800 Pure hypercholesterolemia, unspecified: Secondary | ICD-10-CM | POA: Diagnosis not present

## 2021-02-27 DIAGNOSIS — E1169 Type 2 diabetes mellitus with other specified complication: Secondary | ICD-10-CM | POA: Diagnosis not present

## 2021-02-27 DIAGNOSIS — N200 Calculus of kidney: Secondary | ICD-10-CM | POA: Diagnosis not present

## 2021-02-27 DIAGNOSIS — I440 Atrioventricular block, first degree: Secondary | ICD-10-CM | POA: Diagnosis not present

## 2021-02-27 DIAGNOSIS — F3341 Major depressive disorder, recurrent, in partial remission: Secondary | ICD-10-CM | POA: Diagnosis not present

## 2021-02-27 DIAGNOSIS — E11319 Type 2 diabetes mellitus with unspecified diabetic retinopathy without macular edema: Secondary | ICD-10-CM | POA: Diagnosis not present

## 2021-02-27 DIAGNOSIS — M159 Polyosteoarthritis, unspecified: Secondary | ICD-10-CM | POA: Diagnosis not present

## 2021-02-27 DIAGNOSIS — K219 Gastro-esophageal reflux disease without esophagitis: Secondary | ICD-10-CM | POA: Diagnosis not present

## 2021-02-27 DIAGNOSIS — F419 Anxiety disorder, unspecified: Secondary | ICD-10-CM | POA: Diagnosis not present

## 2021-04-03 ENCOUNTER — Ambulatory Visit (INDEPENDENT_AMBULATORY_CARE_PROVIDER_SITE_OTHER): Payer: Medicare HMO

## 2021-04-03 DIAGNOSIS — I441 Atrioventricular block, second degree: Secondary | ICD-10-CM | POA: Diagnosis not present

## 2021-04-03 LAB — CUP PACEART REMOTE DEVICE CHECK
Battery Remaining Longevity: 132 mo
Battery Voltage: 3.09 V
Brady Statistic AP VP Percent: 5.64 %
Brady Statistic AP VS Percent: 0.15 %
Brady Statistic AS VP Percent: 91.19 %
Brady Statistic AS VS Percent: 3.01 %
Brady Statistic RA Percent Paced: 5.74 %
Brady Statistic RV Percent Paced: 96.83 %
Date Time Interrogation Session: 20221201204017
Implantable Lead Implant Date: 20220304
Implantable Lead Implant Date: 20220304
Implantable Lead Location: 753859
Implantable Lead Location: 753860
Implantable Lead Model: 5076
Implantable Lead Model: 5076
Implantable Pulse Generator Implant Date: 20220304
Lead Channel Impedance Value: 285 Ohm
Lead Channel Impedance Value: 342 Ohm
Lead Channel Impedance Value: 361 Ohm
Lead Channel Impedance Value: 437 Ohm
Lead Channel Pacing Threshold Amplitude: 0.5 V
Lead Channel Pacing Threshold Amplitude: 0.875 V
Lead Channel Pacing Threshold Pulse Width: 0.4 ms
Lead Channel Pacing Threshold Pulse Width: 0.4 ms
Lead Channel Sensing Intrinsic Amplitude: 2.75 mV
Lead Channel Sensing Intrinsic Amplitude: 2.75 mV
Lead Channel Sensing Intrinsic Amplitude: 9.125 mV
Lead Channel Sensing Intrinsic Amplitude: 9.125 mV
Lead Channel Setting Pacing Amplitude: 2 V
Lead Channel Setting Pacing Amplitude: 2.5 V
Lead Channel Setting Pacing Pulse Width: 0.4 ms
Lead Channel Setting Sensing Sensitivity: 1.2 mV

## 2021-04-14 NOTE — Progress Notes (Signed)
Remote pacemaker transmission.   

## 2021-05-29 DIAGNOSIS — I440 Atrioventricular block, first degree: Secondary | ICD-10-CM | POA: Diagnosis not present

## 2021-05-29 DIAGNOSIS — M159 Polyosteoarthritis, unspecified: Secondary | ICD-10-CM | POA: Diagnosis not present

## 2021-05-29 DIAGNOSIS — K219 Gastro-esophageal reflux disease without esophagitis: Secondary | ICD-10-CM | POA: Diagnosis not present

## 2021-05-29 DIAGNOSIS — E785 Hyperlipidemia, unspecified: Secondary | ICD-10-CM | POA: Diagnosis not present

## 2021-05-29 DIAGNOSIS — E7800 Pure hypercholesterolemia, unspecified: Secondary | ICD-10-CM | POA: Diagnosis not present

## 2021-05-29 DIAGNOSIS — E11319 Type 2 diabetes mellitus with unspecified diabetic retinopathy without macular edema: Secondary | ICD-10-CM | POA: Diagnosis not present

## 2021-05-29 DIAGNOSIS — N200 Calculus of kidney: Secondary | ICD-10-CM | POA: Diagnosis not present

## 2021-05-29 DIAGNOSIS — E1169 Type 2 diabetes mellitus with other specified complication: Secondary | ICD-10-CM | POA: Diagnosis not present

## 2021-05-29 DIAGNOSIS — F3341 Major depressive disorder, recurrent, in partial remission: Secondary | ICD-10-CM | POA: Diagnosis not present

## 2021-05-29 DIAGNOSIS — F419 Anxiety disorder, unspecified: Secondary | ICD-10-CM | POA: Diagnosis not present

## 2021-07-03 ENCOUNTER — Ambulatory Visit (INDEPENDENT_AMBULATORY_CARE_PROVIDER_SITE_OTHER): Payer: Medicare HMO

## 2021-07-03 DIAGNOSIS — I442 Atrioventricular block, complete: Secondary | ICD-10-CM | POA: Diagnosis not present

## 2021-07-03 LAB — CUP PACEART REMOTE DEVICE CHECK
Battery Remaining Longevity: 130 mo
Battery Voltage: 3.05 V
Brady Statistic AP VP Percent: 3.61 %
Brady Statistic AP VS Percent: 0.16 %
Brady Statistic AS VP Percent: 93.19 %
Brady Statistic AS VS Percent: 3.04 %
Brady Statistic RA Percent Paced: 3.72 %
Brady Statistic RV Percent Paced: 96.8 %
Date Time Interrogation Session: 20230302203256
Implantable Lead Implant Date: 20220304
Implantable Lead Implant Date: 20220304
Implantable Lead Location: 753859
Implantable Lead Location: 753860
Implantable Lead Model: 5076
Implantable Lead Model: 5076
Implantable Pulse Generator Implant Date: 20220304
Lead Channel Impedance Value: 285 Ohm
Lead Channel Impedance Value: 342 Ohm
Lead Channel Impedance Value: 361 Ohm
Lead Channel Impedance Value: 437 Ohm
Lead Channel Pacing Threshold Amplitude: 0.5 V
Lead Channel Pacing Threshold Amplitude: 0.875 V
Lead Channel Pacing Threshold Pulse Width: 0.4 ms
Lead Channel Pacing Threshold Pulse Width: 0.4 ms
Lead Channel Sensing Intrinsic Amplitude: 2.75 mV
Lead Channel Sensing Intrinsic Amplitude: 2.75 mV
Lead Channel Sensing Intrinsic Amplitude: 7 mV
Lead Channel Sensing Intrinsic Amplitude: 7 mV
Lead Channel Setting Pacing Amplitude: 2 V
Lead Channel Setting Pacing Amplitude: 2.5 V
Lead Channel Setting Pacing Pulse Width: 0.4 ms
Lead Channel Setting Sensing Sensitivity: 1.2 mV

## 2021-07-08 DIAGNOSIS — Z9181 History of falling: Secondary | ICD-10-CM | POA: Diagnosis not present

## 2021-07-08 DIAGNOSIS — Z139 Encounter for screening, unspecified: Secondary | ICD-10-CM | POA: Diagnosis not present

## 2021-07-08 DIAGNOSIS — Z0000 Encounter for general adult medical examination without abnormal findings: Secondary | ICD-10-CM | POA: Diagnosis not present

## 2021-07-08 DIAGNOSIS — Z1331 Encounter for screening for depression: Secondary | ICD-10-CM | POA: Diagnosis not present

## 2021-07-08 DIAGNOSIS — E785 Hyperlipidemia, unspecified: Secondary | ICD-10-CM | POA: Diagnosis not present

## 2021-07-13 NOTE — Progress Notes (Signed)
Remote pacemaker transmission.   

## 2021-07-27 ENCOUNTER — Ambulatory Visit (INDEPENDENT_AMBULATORY_CARE_PROVIDER_SITE_OTHER): Payer: Medicare HMO | Admitting: Cardiology

## 2021-07-27 ENCOUNTER — Other Ambulatory Visit: Payer: Self-pay

## 2021-07-27 ENCOUNTER — Encounter: Payer: Self-pay | Admitting: Cardiology

## 2021-07-27 VITALS — BP 132/72 | HR 65 | Ht 73.5 in | Wt 205.2 lb

## 2021-07-27 DIAGNOSIS — I441 Atrioventricular block, second degree: Secondary | ICD-10-CM

## 2021-07-27 LAB — CUP PACEART INCLINIC DEVICE CHECK
Battery Remaining Longevity: 128 mo
Battery Voltage: 3.04 V
Brady Statistic AP VP Percent: 4.98 %
Brady Statistic AP VS Percent: 0.14 %
Brady Statistic AS VP Percent: 91.96 %
Brady Statistic AS VS Percent: 2.92 %
Brady Statistic RA Percent Paced: 5.09 %
Brady Statistic RV Percent Paced: 96.94 %
Date Time Interrogation Session: 20230327150909
Implantable Lead Implant Date: 20220304
Implantable Lead Implant Date: 20220304
Implantable Lead Location: 753859
Implantable Lead Location: 753860
Implantable Lead Model: 5076
Implantable Lead Model: 5076
Implantable Pulse Generator Implant Date: 20220304
Lead Channel Impedance Value: 285 Ohm
Lead Channel Impedance Value: 342 Ohm
Lead Channel Impedance Value: 361 Ohm
Lead Channel Impedance Value: 418 Ohm
Lead Channel Pacing Threshold Amplitude: 0.375 V
Lead Channel Pacing Threshold Amplitude: 0.875 V
Lead Channel Pacing Threshold Pulse Width: 0.4 ms
Lead Channel Pacing Threshold Pulse Width: 0.4 ms
Lead Channel Sensing Intrinsic Amplitude: 2.75 mV
Lead Channel Sensing Intrinsic Amplitude: 3.375 mV
Lead Channel Sensing Intrinsic Amplitude: 6.75 mV
Lead Channel Sensing Intrinsic Amplitude: 8.75 mV
Lead Channel Setting Pacing Amplitude: 2 V
Lead Channel Setting Pacing Amplitude: 2.5 V
Lead Channel Setting Pacing Pulse Width: 0.4 ms
Lead Channel Setting Sensing Sensitivity: 1.2 mV

## 2021-07-27 MED ORDER — NITROGLYCERIN 0.4 MG SL SUBL: 0.4000 mg | SUBLINGUAL_TABLET | SUBLINGUAL | 3 refills | Status: DC | PRN

## 2021-07-27 NOTE — Progress Notes (Signed)
? ?Electrophysiology Office Note ? ? ?Date:  07/27/2021  ? ?ID:  William Tate, DOB 1951-03-30, MRN [id number] ? ?PCP:  Simone Curia, MD  ?Cardiologist:  T[place] ?Primary Electrophysiologist:  Britian Jentz Jorja Loa, MD   ? ?Chief Complaint: fatigue ?  ?History of Present Illness: ?William Tate is a 71 y.o. male who is being seen today for the evaluation of second degree AV block at the request of Simone Curia, MD. Presenting today for electrophysiology evaluation. ? ?He has a history significant for hyperlipidemia and type 2 diabetes.  Left heart catheterization showed mild nonobstructive coronary artery disease.  He developed worsening shortness of breath and fatigue.  He wore a cardiac monitor that showed Mobitz 1 AV block with associated symptoms.  He is now status post Medtronic dual-chamber pacemaker implanted 07/04/2020. ? ?Today, denies symptoms of palpitations, chest pain, shortness of breath, orthopnea, PND, lower extremity edema, claudication, dizziness, presyncope, syncope, bleeding, or neurologic sequela. The patient is tolerating medications without difficulties.  Since being seen he has done well.  He was initially short of breath right after his pacemaker was implanted but this has improved.  Able to do all of his daily activities without restriction.  He is overall happy with how he has been feeling. ? ? ?Past Medical History:  ?Diagnosis Date  ? GERD (gastroesophageal reflux disease)   ? Hyperlipidemia   ? Type 2 diabetes mellitus (HCC)   ? ?Past Surgical History:  ?Procedure Laterality Date  ? PACEMAKER IMPLANT N/A 07/04/2020  ? Procedure: PACEMAKER IMPLANT;  Surgeon: Regan Lemming, MD;  Location: [facility] INVASIVE CV LAB;  Service: Cardiovascular;  Laterality: N/A;  ? REPLACEMENT TOTAL KNEE Left   ? RIGHT/LEFT HEART CATH AND CORONARY ANGIOGRAPHY N/A 06/03/2020  ? Procedure: RIGHT/LEFT HEART CATH AND CORONARY ANGIOGRAPHY;  Surgeon: Marykay Lex, MD;  Location: [facility] INVASIVE CV LAB;  Service: Cardiovascular;   Laterality: N/A;  ? ? ? ?Current Outpatient Medications  ?Medication Sig Dispense Refill  ? aspirin 81 MG EC tablet Take 81 mg by mouth daily. Swallow whole.    ? atorvastatin (LIPITOR) 10 MG tablet Take 10 mg by mouth daily.    ? Cholecalciferol (VITAMIN D3) 25 MCG (1000 UT) CAPS Take 1 Units by mouth daily.    ? citalopram (CELEXA) 10 MG tablet Take 10 mg by mouth daily.    ? glimepiride (AMARYL) 4 MG tablet Take 2 mg by mouth daily with lunch.    ? ibuprofen (ADVIL) 200 MG tablet Take 800 mg by mouth every 8 (eight) hours as needed (for pain).    ? metFORMIN (GLUCOPHAGE) 850 MG tablet Take 850 mg by mouth in the morning and at bedtime.    ? omeprazole (PRILOSEC) 20 MG capsule Take 20 mg by mouth daily.    ? vitamin C (ASCORBIC ACID) 500 MG tablet Take 500 mg by mouth daily.    ? nitroGLYCERIN (NITROSTAT) 0.4 MG SL tablet Place 1 tablet (0.4 mg total) under the tongue every 5 (five) minutes as needed for chest pain. 25 tablet 3  ? ?No current facility-administered medications for this visit.  ? ? ?Allergies:   Patient has no known allergies.  ? ?Social History:  The patient  reports that he has quit smoking. He has never used smokeless tobacco. He reports that he does not drink alcohol and does not use drugs.  ? ?Family History:  The patient's family history includes Diabetes in his brother and maternal grandmother; Heart Problems in his sister; Heart  disease in his brother; Hypertension in his brother, father, and mother; Stroke in his mother.  ? ?ROS:  Please see the history of present illness.   Otherwise, review of systems is positive for none.   All other systems are reviewed and negative.  ? ?PHYSICAL EXAM: ?VS:  BP 132/72   Pulse 65   Ht 6' 1.5" (1.867 m)   Wt 205 lb 3.2 oz (93.1 kg)   SpO2 98%   BMI 26.71 kg/m?  , BMI Body mass index is 26.71 kg/m?. ?GEN: Well nourished, well developed, in no acute distress  ?HEENT: normal  ?Neck: no JVD, carotid bruits, or masses ?Cardiac: RRR; no murmurs, rubs, or  gallops,no edema  ?Respiratory:  clear to auscultation bilaterally, normal work of breathing ?GI: soft, nontender, nondistended, + BS ?MS: no deformity or atrophy  ?Skin: warm and dry, device site well healed ?Neuro:  Strength and sensation are intact ?Psych: euthymic mood, full affect ? ?EKG:  EKG is ordered today. ?Personal review of the ekg ordered shows atrial sensed, ventricular paced ? ?Personal review of the device interrogation today. Results in Paceart  ? ?Recent Labs: ?10/20/2020: NT-Pro BNP 135  ? ? ?Lipid Panel  ?   ?Component Value Date/Time  ? CHOL 149 09/17/2020 0811  ? TRIG 56 09/17/2020 0811  ? HDL 64 09/17/2020 0811  ? CHOLHDL 2.3 09/17/2020 0811  ? LDLCALC 73 09/17/2020 0811  ? ? ? ?Wt Readings from Last 3 Encounters:  ?07/27/21 205 lb 3.2 oz (93.1 kg)  ?10/20/20 205 lb (93 kg)  ?09/16/20 207 lb (93.9 kg)  ?  ? ? ?Other studies Reviewed: ?Additional studies/ records that were reviewed today include: LHC 06/03/20  ?Review of the above records today demonstrates:  ?The left ventricular systolic function is normal. The left ventricular ejection fraction is 55-65% by visual estimate. ?LV end diastolic pressure is normal. ?Normal Right Heart Cath Numbers-Cardiac Output Index and Pressures. ?Mid LAD lesion is 20% stenosed. Otherwise minimal CAD. ? ?Cardiac monitor 06/16/2020 personally reviewed ?Mobitz 1 second-degree AV block with episodes of SVT ? ? ? ?ASSESSMENT AND PLAN: ? ?1.  Mobitz 1 AV block: Patient was having symptoms of shortness of breath and fatigue.  He is status post Medtronic dual-chamber pacemaker implanted 07/04/2020.  Device functioning appropriately.  No changes. ? ? ?Current medicines are reviewed at length with the patient today.   ?The patient does not have concerns regarding his medicines.  The following changes were made today: None ? ?Labs/ tests ordered today include:  ?Orders Placed This Encounter  ?Procedures  ? EKG 12-Lead  ? ? ? ? ?Disposition:   FU with Jacyln Carmer 12  months ? ?Signed, ?Meleena Munroe Jorja Loa, MD  ?07/27/2021 11:47 AM    ? ?[facility] ?[address] ?[address] ?[place] [address] ?[phone] (office) ?[phone] (fax) ? ?

## 2021-08-11 ENCOUNTER — Encounter: Payer: Self-pay | Admitting: Cardiology

## 2021-08-11 ENCOUNTER — Ambulatory Visit: Payer: Medicare HMO | Admitting: Cardiology

## 2021-08-11 VITALS — BP 132/74 | HR 59 | Ht 73.5 in | Wt 205.6 lb

## 2021-08-11 DIAGNOSIS — E782 Mixed hyperlipidemia: Secondary | ICD-10-CM | POA: Diagnosis not present

## 2021-08-11 DIAGNOSIS — Z950 Presence of cardiac pacemaker: Secondary | ICD-10-CM

## 2021-08-11 DIAGNOSIS — I2510 Atherosclerotic heart disease of native coronary artery without angina pectoris: Secondary | ICD-10-CM

## 2021-08-11 DIAGNOSIS — I441 Atrioventricular block, second degree: Secondary | ICD-10-CM | POA: Diagnosis not present

## 2021-08-11 NOTE — Patient Instructions (Signed)
Medication Instructions:  Your physician recommends that you continue on your current medications as directed. Please refer to the Current Medication list given to you today.  *If you need a refill on your cardiac medications before your next appointment, please call your pharmacy*   Lab Work: NONE If you have labs (blood work) drawn today and your tests are completely normal, you will receive your results only by: MyChart Message (if you have MyChart) OR A paper copy in the mail If you have any lab test that is abnormal or we need to change your treatment, we will call you to review the results.   Testing/Procedures: NONE   Follow-Up: At [facility], you and your health needs are our priority.  As part of our continuing mission to provide you with exceptional heart care, we have created designated Provider Care Teams.  These Care Teams include your primary Cardiologist (physician) and Advanced Practice Providers (APPs -  Physician Assistants and Nurse Practitioners) who all work together to provide you with the care you need, when you need it.  We recommend signing up for the patient portal called "MyChart".  Sign up information is provided on this After Visit Summary.  MyChart is used to connect with patients for Virtual Visits (Telemedicine).  Patients are able to view lab/test results, encounter notes, upcoming appointments, etc.  Non-urgent messages can be sent to your provider as well.   To learn more about what you can do with MyChart, go to [email or web address].    Your next appointment:   1 year(s)  The format for your next appointment:   In Person  Provider:   Robert Krasowski, MD    Other Instructions   Important Information About Sugar       

## 2021-08-11 NOTE — Progress Notes (Signed)
?Cardiology Office Note:   ? ?Date:  08/11/2021  ? ?ID:  William Tate, DOB January 02, 1951, MRN [id number] ? ?PCP:  Simone Curia, MD  ?Cardiologist:  Gypsy Balsam, MD   ? ?Referring MD: Simone Curia, MD  ? ?Chief Complaint  ?Patient presents with  ? Follow-up  ?I am doing great ? ?History of Present Illness:   ? ?William Tate is a 71 y.o. male with past medical history significant for coronary artery disease, he did have cardiac catheterization February 2022 which showed only minimal coronary artery disease.  Mild up to 20% stenosis of the mid LAD, right side cardiac catheterization was normal.  He was also find to have second-degree type II AV block and pacemaker has been implanted since that time he is doing very well.  Also essential hypertension dyslipidemia diabetes.  Comes today to my office for follow-up overall he is doing very well.  He denies have any chest pain tightness squeezing pressure burning chest no palpitation dizziness swelling of lower extremities. ? ?Past Medical History:  ?Diagnosis Date  ? GERD (gastroesophageal reflux disease)   ? Hyperlipidemia   ? Type 2 diabetes mellitus (HCC)   ? ? ?Past Surgical History:  ?Procedure Laterality Date  ? PACEMAKER IMPLANT N/A 07/04/2020  ? Procedure: PACEMAKER IMPLANT;  Surgeon: Regan Lemming, MD;  Location: [facility] INVASIVE CV LAB;  Service: Cardiovascular;  Laterality: N/A;  ? REPLACEMENT TOTAL KNEE Left   ? RIGHT/LEFT HEART CATH AND CORONARY ANGIOGRAPHY N/A 06/03/2020  ? Procedure: RIGHT/LEFT HEART CATH AND CORONARY ANGIOGRAPHY;  Surgeon: Marykay Lex, MD;  Location: [facility] INVASIVE CV LAB;  Service: Cardiovascular;  Laterality: N/A;  ? ? ?Current Medications: ?Current Meds  ?Medication Sig  ? aspirin 81 MG EC tablet Take 81 mg by mouth daily. Swallow whole.  ? atorvastatin (LIPITOR) 10 MG tablet Take 10 mg by mouth daily.  ? Cholecalciferol (VITAMIN D3) 25 MCG (1000 UT) CAPS Take 1 Units by mouth daily.  ? citalopram (CELEXA) 10 MG tablet Take 20 mg by  mouth daily.  ? glimepiride (AMARYL) 4 MG tablet Take 2 mg by mouth daily with lunch.  ? ibuprofen (ADVIL) 200 MG tablet Take 800 mg by mouth every 8 (eight) hours as needed for mild pain or moderate pain (for pain).  ? metFORMIN (GLUCOPHAGE) 850 MG tablet Take 850 mg by mouth in the morning and at bedtime.  ? nitroGLYCERIN (NITROSTAT) 0.4 MG SL tablet Place 1 tablet (0.4 mg total) under the tongue every 5 (five) minutes as needed for chest pain.  ? omeprazole (PRILOSEC) 20 MG capsule Take 20 mg by mouth daily.  ? vitamin C (ASCORBIC ACID) 500 MG tablet Take 500 mg by mouth daily.  ?  ? ?Allergies:   Patient has no known allergies.  ? ?Social History  ? ?Socioeconomic History  ? Marital status: Married  ?  Spouse name: Not on file  ? Number of children: Not on file  ? Years of education: Not on file  ? Highest education level: Not on file  ?Occupational History  ? Not on file  ?Tobacco Use  ? Smoking status: Former  ? Smokeless tobacco: Never  ?Vaping Use  ? Vaping Use: Never used  ?Substance and Sexual Activity  ? Alcohol use: Never  ? Drug use: Never  ? Sexual activity: Not on file  ?Other Topics Concern  ? Not on file  ?Social History Narrative  ? Not on file  ? ?Social Determinants of Health  ? ?  Financial Resource Strain: Not on file  ?Food Insecurity: Not on file  ?Transportation Needs: Not on file  ?Physical Activity: Not on file  ?Stress: Not on file  ?Social Connections: Not on file  ?  ? ?Family History: ?The patient's family history includes Diabetes in his brother and maternal grandmother; Heart Problems in his sister; Heart disease in his brother; Hypertension in his brother, father, and mother; Stroke in his mother. ?ROS:   ?Please see the history of present illness.    ?All 14 point review of systems negative except as described per history of present illness ? ?EKGs/Labs/Other Studies Reviewed:   ? ? ? ?Recent Labs: ?10/20/2020: NT-Pro BNP 135  ?Recent Lipid Panel ?   ?Component Value Date/Time  ? CHOL  149 09/17/2020 0811  ? TRIG 56 09/17/2020 0811  ? HDL 64 09/17/2020 0811  ? CHOLHDL 2.3 09/17/2020 0811  ? LDLCALC 73 09/17/2020 0811  ? ? ?Physical Exam:   ? ?VS:  BP 132/74 (BP Location: Left Arm, Patient Position: Sitting)   Pulse (!) 59   Ht 6' 1.5" (1.867 m)   Wt 205 lb 9.6 oz (93.3 kg)   SpO2 95%   BMI 26.76 kg/m?    ? ?Wt Readings from Last 3 Encounters:  ?08/11/21 205 lb 9.6 oz (93.3 kg)  ?07/27/21 205 lb 3.2 oz (93.1 kg)  ?10/20/20 205 lb (93 kg)  ?  ? ?GEN:  Well nourished, well developed in no acute distress ?HEENT: Normal ?NECK: No JVD; No carotid bruits ?LYMPHATICS: No lymphadenopathy ?CARDIAC: RRR, no murmurs, no rubs, no gallops ?RESPIRATORY:  Clear to auscultation without rales, wheezing or rhonchi  ?ABDOMEN: Soft, non-tender, non-distended ?MUSCULOSKELETAL:  No edema; No deformity  ?SKIN: Warm and dry ?LOWER EXTREMITIES: no swelling ?NEUROLOGIC:  Alert and oriented x 3 ?PSYCHIATRIC:  Normal affect  ? ?ASSESSMENT:   ? ?1. Mixed hyperlipidemia   ?2. Pacemaker   ?3. Coronary artery disease involving native coronary artery of native heart without angina pectoris   ?4. Atrioventricular block, Mobitz type 1, Wenckebach   ? ?PLAN:   ? ?In order of problems listed above: ? ?Coronary disease stable from that point review on antiplatelet therapy which I will continue also on statin which I will continue. ?Pacemaker present I did review interrogation normal function continue present management. ?Mixed dyslipidemia he is on Lipitor 10 which I will continue I did review K PN which show me his LDL 73 HDL 62 continue present management ?Advanced heart block status post pacemaker doing well ? ? ?Medication Adjustments/Labs and Tests Ordered: ?Current medicines are reviewed at length with the patient today.  Concerns regarding medicines are outlined above.  ?No orders of the defined types were placed in this encounter. ? ?Medication changes: No orders of the defined types were placed in this  encounter. ? ? ?Signed, ?Georgeanna Lea, MD, [facility] ?08/11/2021 10:07 AM    ?[facility] ?

## 2021-08-28 DIAGNOSIS — E11319 Type 2 diabetes mellitus with unspecified diabetic retinopathy without macular edema: Secondary | ICD-10-CM | POA: Diagnosis not present

## 2021-08-28 DIAGNOSIS — K219 Gastro-esophageal reflux disease without esophagitis: Secondary | ICD-10-CM | POA: Diagnosis not present

## 2021-08-28 DIAGNOSIS — N200 Calculus of kidney: Secondary | ICD-10-CM | POA: Diagnosis not present

## 2021-08-28 DIAGNOSIS — I440 Atrioventricular block, first degree: Secondary | ICD-10-CM | POA: Diagnosis not present

## 2021-08-28 DIAGNOSIS — M159 Polyosteoarthritis, unspecified: Secondary | ICD-10-CM | POA: Diagnosis not present

## 2021-08-28 DIAGNOSIS — E1169 Type 2 diabetes mellitus with other specified complication: Secondary | ICD-10-CM | POA: Diagnosis not present

## 2021-08-28 DIAGNOSIS — F419 Anxiety disorder, unspecified: Secondary | ICD-10-CM | POA: Diagnosis not present

## 2021-08-28 DIAGNOSIS — E559 Vitamin D deficiency, unspecified: Secondary | ICD-10-CM | POA: Diagnosis not present

## 2021-08-28 DIAGNOSIS — E7800 Pure hypercholesterolemia, unspecified: Secondary | ICD-10-CM | POA: Diagnosis not present

## 2021-08-28 DIAGNOSIS — E785 Hyperlipidemia, unspecified: Secondary | ICD-10-CM | POA: Diagnosis not present

## 2021-08-28 DIAGNOSIS — F3341 Major depressive disorder, recurrent, in partial remission: Secondary | ICD-10-CM | POA: Diagnosis not present

## 2021-09-03 DIAGNOSIS — E11319 Type 2 diabetes mellitus with unspecified diabetic retinopathy without macular edema: Secondary | ICD-10-CM | POA: Diagnosis not present

## 2021-09-03 DIAGNOSIS — I440 Atrioventricular block, first degree: Secondary | ICD-10-CM | POA: Diagnosis not present

## 2021-09-03 DIAGNOSIS — M159 Polyosteoarthritis, unspecified: Secondary | ICD-10-CM | POA: Diagnosis not present

## 2021-09-03 DIAGNOSIS — N200 Calculus of kidney: Secondary | ICD-10-CM | POA: Diagnosis not present

## 2021-09-03 DIAGNOSIS — K219 Gastro-esophageal reflux disease without esophagitis: Secondary | ICD-10-CM | POA: Diagnosis not present

## 2021-09-03 DIAGNOSIS — F419 Anxiety disorder, unspecified: Secondary | ICD-10-CM | POA: Diagnosis not present

## 2021-09-03 DIAGNOSIS — E1169 Type 2 diabetes mellitus with other specified complication: Secondary | ICD-10-CM | POA: Diagnosis not present

## 2021-09-03 DIAGNOSIS — E785 Hyperlipidemia, unspecified: Secondary | ICD-10-CM | POA: Diagnosis not present

## 2021-09-03 DIAGNOSIS — N410 Acute prostatitis: Secondary | ICD-10-CM | POA: Diagnosis not present

## 2021-09-10 DIAGNOSIS — I440 Atrioventricular block, first degree: Secondary | ICD-10-CM | POA: Diagnosis not present

## 2021-09-10 DIAGNOSIS — E785 Hyperlipidemia, unspecified: Secondary | ICD-10-CM | POA: Diagnosis not present

## 2021-09-10 DIAGNOSIS — M159 Polyosteoarthritis, unspecified: Secondary | ICD-10-CM | POA: Diagnosis not present

## 2021-09-10 DIAGNOSIS — F3341 Major depressive disorder, recurrent, in partial remission: Secondary | ICD-10-CM | POA: Diagnosis not present

## 2021-09-10 DIAGNOSIS — K219 Gastro-esophageal reflux disease without esophagitis: Secondary | ICD-10-CM | POA: Diagnosis not present

## 2021-09-10 DIAGNOSIS — E1169 Type 2 diabetes mellitus with other specified complication: Secondary | ICD-10-CM | POA: Diagnosis not present

## 2021-09-10 DIAGNOSIS — N200 Calculus of kidney: Secondary | ICD-10-CM | POA: Diagnosis not present

## 2021-09-10 DIAGNOSIS — E11319 Type 2 diabetes mellitus with unspecified diabetic retinopathy without macular edema: Secondary | ICD-10-CM | POA: Diagnosis not present

## 2021-09-10 DIAGNOSIS — F419 Anxiety disorder, unspecified: Secondary | ICD-10-CM | POA: Diagnosis not present

## 2021-10-02 ENCOUNTER — Ambulatory Visit (INDEPENDENT_AMBULATORY_CARE_PROVIDER_SITE_OTHER): Payer: Medicare HMO

## 2021-10-02 DIAGNOSIS — I442 Atrioventricular block, complete: Secondary | ICD-10-CM | POA: Diagnosis not present

## 2021-10-02 LAB — CUP PACEART REMOTE DEVICE CHECK
Battery Remaining Longevity: 128 mo
Battery Voltage: 3.03 V
Brady Statistic AP VP Percent: 4.35 %
Brady Statistic AP VS Percent: 0.3 %
Brady Statistic AS VP Percent: 91.92 %
Brady Statistic AS VS Percent: 3.43 %
Brady Statistic RA Percent Paced: 4.6 %
Brady Statistic RV Percent Paced: 96.27 %
Date Time Interrogation Session: 20230602085525
Implantable Lead Implant Date: 20220304
Implantable Lead Implant Date: 20220304
Implantable Lead Location: 753859
Implantable Lead Location: 753860
Implantable Lead Model: 5076
Implantable Lead Model: 5076
Implantable Pulse Generator Implant Date: 20220304
Lead Channel Impedance Value: 304 Ohm
Lead Channel Impedance Value: 361 Ohm
Lead Channel Impedance Value: 380 Ohm
Lead Channel Impedance Value: 456 Ohm
Lead Channel Pacing Threshold Amplitude: 0.375 V
Lead Channel Pacing Threshold Amplitude: 1 V
Lead Channel Pacing Threshold Pulse Width: 0.4 ms
Lead Channel Pacing Threshold Pulse Width: 0.4 ms
Lead Channel Sensing Intrinsic Amplitude: 3.25 mV
Lead Channel Sensing Intrinsic Amplitude: 3.25 mV
Lead Channel Sensing Intrinsic Amplitude: 5.875 mV
Lead Channel Sensing Intrinsic Amplitude: 5.875 mV
Lead Channel Setting Pacing Amplitude: 2 V
Lead Channel Setting Pacing Amplitude: 2.5 V
Lead Channel Setting Pacing Pulse Width: 0.4 ms
Lead Channel Setting Sensing Sensitivity: 1.2 mV

## 2021-10-09 DIAGNOSIS — I440 Atrioventricular block, first degree: Secondary | ICD-10-CM | POA: Diagnosis not present

## 2021-10-09 DIAGNOSIS — Z6828 Body mass index (BMI) 28.0-28.9, adult: Secondary | ICD-10-CM | POA: Diagnosis not present

## 2021-10-09 DIAGNOSIS — M159 Polyosteoarthritis, unspecified: Secondary | ICD-10-CM | POA: Diagnosis not present

## 2021-10-09 DIAGNOSIS — E1169 Type 2 diabetes mellitus with other specified complication: Secondary | ICD-10-CM | POA: Diagnosis not present

## 2021-10-09 DIAGNOSIS — K219 Gastro-esophageal reflux disease without esophagitis: Secondary | ICD-10-CM | POA: Diagnosis not present

## 2021-10-09 DIAGNOSIS — N200 Calculus of kidney: Secondary | ICD-10-CM | POA: Diagnosis not present

## 2021-10-09 DIAGNOSIS — E785 Hyperlipidemia, unspecified: Secondary | ICD-10-CM | POA: Diagnosis not present

## 2021-10-09 NOTE — Progress Notes (Signed)
Remote pacemaker transmission.   

## 2021-10-23 DIAGNOSIS — M159 Polyosteoarthritis, unspecified: Secondary | ICD-10-CM | POA: Diagnosis not present

## 2021-10-23 DIAGNOSIS — F419 Anxiety disorder, unspecified: Secondary | ICD-10-CM | POA: Diagnosis not present

## 2021-10-23 DIAGNOSIS — E1169 Type 2 diabetes mellitus with other specified complication: Secondary | ICD-10-CM | POA: Diagnosis not present

## 2021-10-23 DIAGNOSIS — K219 Gastro-esophageal reflux disease without esophagitis: Secondary | ICD-10-CM | POA: Diagnosis not present

## 2021-10-23 DIAGNOSIS — E785 Hyperlipidemia, unspecified: Secondary | ICD-10-CM | POA: Diagnosis not present

## 2021-10-23 DIAGNOSIS — E11319 Type 2 diabetes mellitus with unspecified diabetic retinopathy without macular edema: Secondary | ICD-10-CM | POA: Diagnosis not present

## 2021-10-23 DIAGNOSIS — F3341 Major depressive disorder, recurrent, in partial remission: Secondary | ICD-10-CM | POA: Diagnosis not present

## 2021-10-23 DIAGNOSIS — N200 Calculus of kidney: Secondary | ICD-10-CM | POA: Diagnosis not present

## 2021-10-23 DIAGNOSIS — I440 Atrioventricular block, first degree: Secondary | ICD-10-CM | POA: Diagnosis not present

## 2021-11-20 DIAGNOSIS — E785 Hyperlipidemia, unspecified: Secondary | ICD-10-CM | POA: Diagnosis not present

## 2021-11-20 DIAGNOSIS — I440 Atrioventricular block, first degree: Secondary | ICD-10-CM | POA: Diagnosis not present

## 2021-11-20 DIAGNOSIS — M159 Polyosteoarthritis, unspecified: Secondary | ICD-10-CM | POA: Diagnosis not present

## 2021-11-20 DIAGNOSIS — F3341 Major depressive disorder, recurrent, in partial remission: Secondary | ICD-10-CM | POA: Diagnosis not present

## 2021-11-20 DIAGNOSIS — N200 Calculus of kidney: Secondary | ICD-10-CM | POA: Diagnosis not present

## 2021-11-20 DIAGNOSIS — E11319 Type 2 diabetes mellitus with unspecified diabetic retinopathy without macular edema: Secondary | ICD-10-CM | POA: Diagnosis not present

## 2021-11-20 DIAGNOSIS — E1169 Type 2 diabetes mellitus with other specified complication: Secondary | ICD-10-CM | POA: Diagnosis not present

## 2021-11-20 DIAGNOSIS — F419 Anxiety disorder, unspecified: Secondary | ICD-10-CM | POA: Diagnosis not present

## 2021-11-20 DIAGNOSIS — K219 Gastro-esophageal reflux disease without esophagitis: Secondary | ICD-10-CM | POA: Diagnosis not present

## 2022-01-01 ENCOUNTER — Ambulatory Visit (INDEPENDENT_AMBULATORY_CARE_PROVIDER_SITE_OTHER): Payer: Medicare HMO

## 2022-01-01 DIAGNOSIS — I442 Atrioventricular block, complete: Secondary | ICD-10-CM | POA: Diagnosis not present

## 2022-01-04 LAB — CUP PACEART REMOTE DEVICE CHECK
Battery Remaining Longevity: 124 mo
Battery Voltage: 3.02 V
Brady Statistic AP VP Percent: 6.1 %
Brady Statistic AP VS Percent: 0.18 %
Brady Statistic AS VP Percent: 88.74 %
Brady Statistic AS VS Percent: 4.98 %
Brady Statistic RA Percent Paced: 6.23 %
Brady Statistic RV Percent Paced: 94.84 %
Date Time Interrogation Session: 20230901002107
Implantable Lead Implant Date: 20220304
Implantable Lead Implant Date: 20220304
Implantable Lead Location: 753859
Implantable Lead Location: 753860
Implantable Lead Model: 5076
Implantable Lead Model: 5076
Implantable Pulse Generator Implant Date: 20220304
Lead Channel Impedance Value: 285 Ohm
Lead Channel Impedance Value: 323 Ohm
Lead Channel Impedance Value: 361 Ohm
Lead Channel Impedance Value: 437 Ohm
Lead Channel Pacing Threshold Amplitude: 0.5 V
Lead Channel Pacing Threshold Amplitude: 1 V
Lead Channel Pacing Threshold Pulse Width: 0.4 ms
Lead Channel Pacing Threshold Pulse Width: 0.4 ms
Lead Channel Sensing Intrinsic Amplitude: 2.75 mV
Lead Channel Sensing Intrinsic Amplitude: 2.75 mV
Lead Channel Sensing Intrinsic Amplitude: 6.125 mV
Lead Channel Sensing Intrinsic Amplitude: 6.125 mV
Lead Channel Setting Pacing Amplitude: 2 V
Lead Channel Setting Pacing Amplitude: 2.5 V
Lead Channel Setting Pacing Pulse Width: 0.4 ms
Lead Channel Setting Sensing Sensitivity: 1.2 mV

## 2022-01-20 NOTE — Progress Notes (Signed)
Remote pacemaker transmission.   

## 2022-03-18 ENCOUNTER — Encounter: Payer: Self-pay | Admitting: Cardiology

## 2022-03-18 DIAGNOSIS — F3341 Major depressive disorder, recurrent, in partial remission: Secondary | ICD-10-CM | POA: Insufficient documentation

## 2022-03-18 DIAGNOSIS — I440 Atrioventricular block, first degree: Secondary | ICD-10-CM | POA: Insufficient documentation

## 2022-03-18 DIAGNOSIS — E11319 Type 2 diabetes mellitus with unspecified diabetic retinopathy without macular edema: Secondary | ICD-10-CM | POA: Insufficient documentation

## 2022-03-18 DIAGNOSIS — M159 Polyosteoarthritis, unspecified: Secondary | ICD-10-CM | POA: Insufficient documentation

## 2022-03-18 DIAGNOSIS — E559 Vitamin D deficiency, unspecified: Secondary | ICD-10-CM | POA: Insufficient documentation

## 2022-03-19 ENCOUNTER — Encounter: Payer: Self-pay | Admitting: Cardiology

## 2022-03-19 DIAGNOSIS — E7800 Pure hypercholesterolemia, unspecified: Secondary | ICD-10-CM | POA: Insufficient documentation

## 2022-03-24 ENCOUNTER — Ambulatory Visit: Payer: Medicare HMO | Attending: Cardiology | Admitting: Cardiology

## 2022-03-24 ENCOUNTER — Encounter: Payer: Self-pay | Admitting: Cardiology

## 2022-03-24 VITALS — BP 142/74 | HR 68 | Ht 74.0 in | Wt 201.0 lb

## 2022-03-24 DIAGNOSIS — Z950 Presence of cardiac pacemaker: Secondary | ICD-10-CM | POA: Diagnosis not present

## 2022-03-24 DIAGNOSIS — E1100 Type 2 diabetes mellitus with hyperosmolarity without nonketotic hyperglycemic-hyperosmolar coma (NKHHC): Secondary | ICD-10-CM

## 2022-03-24 DIAGNOSIS — E7800 Pure hypercholesterolemia, unspecified: Secondary | ICD-10-CM

## 2022-03-24 DIAGNOSIS — R0609 Other forms of dyspnea: Secondary | ICD-10-CM

## 2022-03-24 DIAGNOSIS — I2510 Atherosclerotic heart disease of native coronary artery without angina pectoris: Secondary | ICD-10-CM | POA: Diagnosis not present

## 2022-03-24 DIAGNOSIS — I441 Atrioventricular block, second degree: Secondary | ICD-10-CM

## 2022-03-24 NOTE — Patient Instructions (Signed)

## 2022-03-24 NOTE — Addendum Note (Signed)
Addended by: Baldo Ash D on: 03/24/2022 10:49 AM   Modules accepted: Orders

## 2022-03-24 NOTE — Progress Notes (Signed)
Cardiology Office Note:    Date:  03/24/2022   ID:  DEMICO PLOCH, DOB [birth year], MRN [id number]  PCP:  Simone Curia, MD  Cardiologist:  Gypsy Balsam, MD    Referring MD: Simone Curia, MD   No chief complaint on file.   History of Present Illness:    William Tate is a 71 y.o. male with past medical history significant for coronary artery disease he did have cardiac catheterization in February 2022 which showed only minimal coronary artery disease with mild up to 20% stenosis mid LAD, right side cardiac catheterization was normal.  He was found to have second-degree type II AV block and that being addressed with the pacemaker he also got dyslipidemia, essential hypertension, diabetes, depression.  He comes today to my office of not doing well.  He said he is weak tired exhausted.  He is short of breath all the time and effort will give him shortness of breath.  Another reason for referral was the fact that he had abnormal EKG he was told that he did have anterior wall myocardial infarction based on EKG.  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VASIVE CV LAB;  Service:  Cardiovascular;  Laterality: N/A;    Current Medications: Current Meds  Medication Sig   aspirin 81 MG EC tablet Take 81 mg by mouth daily. Swallow whole.   atorvastatin (LIPITOR) 10 MG tablet Take 10 mg by mouth daily.   citalopram (CELEXA) 20 MG tablet Take 20 mg by mouth daily.   Cyanocobalamin (VITAMIN B-12 IJ) Inject as directed every 30 (thirty) days.   ergocalciferol (VITAMIN D2) 1.25 MG (50000 UT) capsule Take 50,000 Units by mouth once a week.   glimepiride (AMARYL) 4 MG tablet Take 4 mg by mouth 2 (two) times daily.   ibuprofen (ADVIL) 200 MG tablet Take 800 mg by mouth every 8 (eight) hours as needed for mild pain or moderate pain (for pain).   lisinopril (ZESTRIL) 2.5 MG tablet Take 2.5 mg by mouth daily.   metFORMIN (GLUCOPHAGE) 850 MG tablet Take 850 mg by mouth daily with breakfast.   omeprazole (PRILOSEC) 20 MG capsule Take 20 mg by mouth daily.   polycarbophil (FIBERCON) 625 MG tablet Take 625 mg by mouth daily.     Allergies:   Patient has no known allergies.   Social History   Socioeconomic History   Marital status: Married    Spouse name: Not on file   Number of children: Not on file   Years of education: Not on file   Highest education level: Not on file  Occupational History  Not on file  Tobacco Use   Smoking status: Former    Types: Cigarettes    Quit date: 1990    Years since quitting: 33.9   Smokeless tobacco: Never  Vaping Use   Vaping Use: Never used  Substance and Sexual Activity   Alcohol use: Never   Drug use: Never   Sexual activity: Not on file  Other Topics Concern   Not on file  Social History Narrative   Not on file   Social Determinants of Health   Financial Resource Strain: Not on file  Food Insecurity: Not on file  Transportation Needs: Not on file  Physical Activity: Not on file  Stress: Not on file  Social Connections: Not on file     Family History: The patient's family history includes Diabetes in his brother,  father, and maternal grandmother; Heart Problems in his sister; Heart disease in his brother; Hypertension in his brother, father, and mother; Stroke in his mother. ROS:   Please see the history of present illness.    All 14 point review of systems negative except as described per history of present illness  EKGs/Labs/Other Studies Reviewed:      Recent Labs: No results found for requested labs within last 365 days.  Recent Lipid Panel    Component Value Date/Time   CHOL 149 09/17/2020 0811   TRIG 56 09/17/2020 0811   HDL 64 09/17/2020 0811   CHOLHDL 2.3 09/17/2020 0811   LDLCALC 73 09/17/2020 0811    Physical Exam:    VS:  BP (!) 142/74 (BP Location: Left Arm, Patient Position: Sitting)   Pulse 68   Ht 6\' 2"  (1.88 m)   Wt 201 lb (91.2 kg)   SpO2 98%   BMI 25.81 kg/m     Wt Readings from Last 3 Encounters:  03/24/22 201 lb (91.2 kg)  03/19/22 203 lb (92.1 kg)  08/11/21 205 lb 9.6 oz (93.3 kg)     GEN:  Well nourished, well developed in no acute distress HEENT: Normal NECK: No JVD; No carotid bruits LYMPHATICS: No lymphadenopathy CARDIAC: RRR, no murmurs, no rubs, no gallops RESPIRATORY:  Clear to auscultation without rales, wheezing or rhonchi  ABDOMEN: Soft, non-tender, non-distended MUSCULOSKELETAL:  No edema; No deformity  SKIN: Warm and dry LOWER EXTREMITIES: no swelling NEUROLOGIC:  Alert and oriented x 3 PSYCHIATRIC:  Normal affect   ASSESSMENT:    1. Coronary artery disease involving native coronary artery of native heart without angina pectoris   2. Atrioventricular block, Mobitz type 1, Wenckebach   3. Type 2 diabetes mellitus with hyperosmolarity without coma, without long-term current use of insulin (HCC)   4. Pacemaker   5. Hypercholesterolemia    PLAN:    In order of problems listed above:  Coronary disease only luminal disease based on cardiac catheterization from 2022.  I will continue present management including antiplatelet therapy as  well as statin therapy. Dyspnea on exertion will recheck his echocardiogram shows structurally his heart is normal his left ventricle ejection fraction is preserved.  If that is negative then he need to be referred to pulmonologist. After ventricle block Mobitz 2, status post pacemaker.  I did review interrogation of the device normal function, 10.3 was left in the device, overall not 9.9% of time he atrial sensed ventricular paced. Abnormal EKG a will repeat EKG today however he does have paced rhythm if that is the case EKG is uninterpretable in terms of processing of myocardial infarction, will get echocardiogram  to check for segmental wall motion abnormalities. Dyslipidemia: I did review K PN which show me LDL 80 HDL 65 he is on Lipitor 10 will probably benefit from reducing his LDL below 70.  Will consider upgrading his medication from Lipitor 10 to Lipitor 20. He is wife also complaining about having brain fog.  I told him this is highly unlikely be related to his heart.   Medication Adjustments/Labs and Tests Ordered: Current medicines are reviewed at length with the patient today.  Concerns regarding medicines are outlined above.  No orders of the defined types were placed in this encounter.  Medication changes: No orders of the defined types were placed in this encounter.   Signed, Georgeanna Lea, MD, [facility] 03/24/2022 10:41 AM    [facility]

## 2022-04-02 ENCOUNTER — Ambulatory Visit (INDEPENDENT_AMBULATORY_CARE_PROVIDER_SITE_OTHER): Payer: Medicare HMO

## 2022-04-02 DIAGNOSIS — I442 Atrioventricular block, complete: Secondary | ICD-10-CM | POA: Diagnosis not present

## 2022-04-03 LAB — CUP PACEART REMOTE DEVICE CHECK
Battery Remaining Longevity: 121 mo
Battery Voltage: 3.02 V
Brady Statistic AP VP Percent: 5.14 %
Brady Statistic AP VS Percent: 0.11 %
Brady Statistic AS VP Percent: 90.71 %
Brady Statistic AS VS Percent: 4.04 %
Brady Statistic RA Percent Paced: 5.2 %
Brady Statistic RV Percent Paced: 95.85 %
Date Time Interrogation Session: 20231201041847
Implantable Lead Connection Status: 753985
Implantable Lead Connection Status: 753985
Implantable Lead Implant Date: 20220304
Implantable Lead Implant Date: 20220304
Implantable Lead Location: 753859
Implantable Lead Location: 753860
Implantable Lead Model: 5076
Implantable Lead Model: 5076
Implantable Pulse Generator Implant Date: 20220304
Lead Channel Impedance Value: 304 Ohm
Lead Channel Impedance Value: 361 Ohm
Lead Channel Impedance Value: 361 Ohm
Lead Channel Impedance Value: 437 Ohm
Lead Channel Pacing Threshold Amplitude: 0.5 V
Lead Channel Pacing Threshold Amplitude: 0.875 V
Lead Channel Pacing Threshold Pulse Width: 0.4 ms
Lead Channel Pacing Threshold Pulse Width: 0.4 ms
Lead Channel Sensing Intrinsic Amplitude: 3.125 mV
Lead Channel Sensing Intrinsic Amplitude: 3.125 mV
Lead Channel Sensing Intrinsic Amplitude: 6 mV
Lead Channel Sensing Intrinsic Amplitude: 6 mV
Lead Channel Setting Pacing Amplitude: 2 V
Lead Channel Setting Pacing Amplitude: 2.5 V
Lead Channel Setting Pacing Pulse Width: 0.4 ms
Lead Channel Setting Sensing Sensitivity: 1.2 mV
Zone Setting Status: 755011

## 2022-04-09 ENCOUNTER — Ambulatory Visit: Payer: Medicare HMO | Attending: Cardiology

## 2022-04-09 DIAGNOSIS — R0609 Other forms of dyspnea: Secondary | ICD-10-CM | POA: Diagnosis not present

## 2022-04-10 LAB — ECHOCARDIOGRAM COMPLETE
Area-P 1/2: 2.62 cm2
S' Lateral: 3.47 cm

## 2022-04-13 ENCOUNTER — Telehealth: Payer: Self-pay

## 2022-04-13 NOTE — Telephone Encounter (Signed)
-----   Message from Georgeanna Lea, MD sent at 04/13/2022  8:46 AM EST ----- Echocardiogram showed preserved left ventricle ejection fraction, overall looks good

## 2022-04-13 NOTE — Telephone Encounter (Signed)
Patient notified via mychart

## 2022-04-20 NOTE — Progress Notes (Signed)
Remote pacemaker transmission.   

## 2022-06-26 IMAGING — CR DG CHEST 2V
2 series · 2 of 2 positions shown · non-contrast
Comparison: None.

CLINICAL DATA: Dual lead pacemaker placement

EXAM:
CHEST - 2 VIEW

[w chest pa]
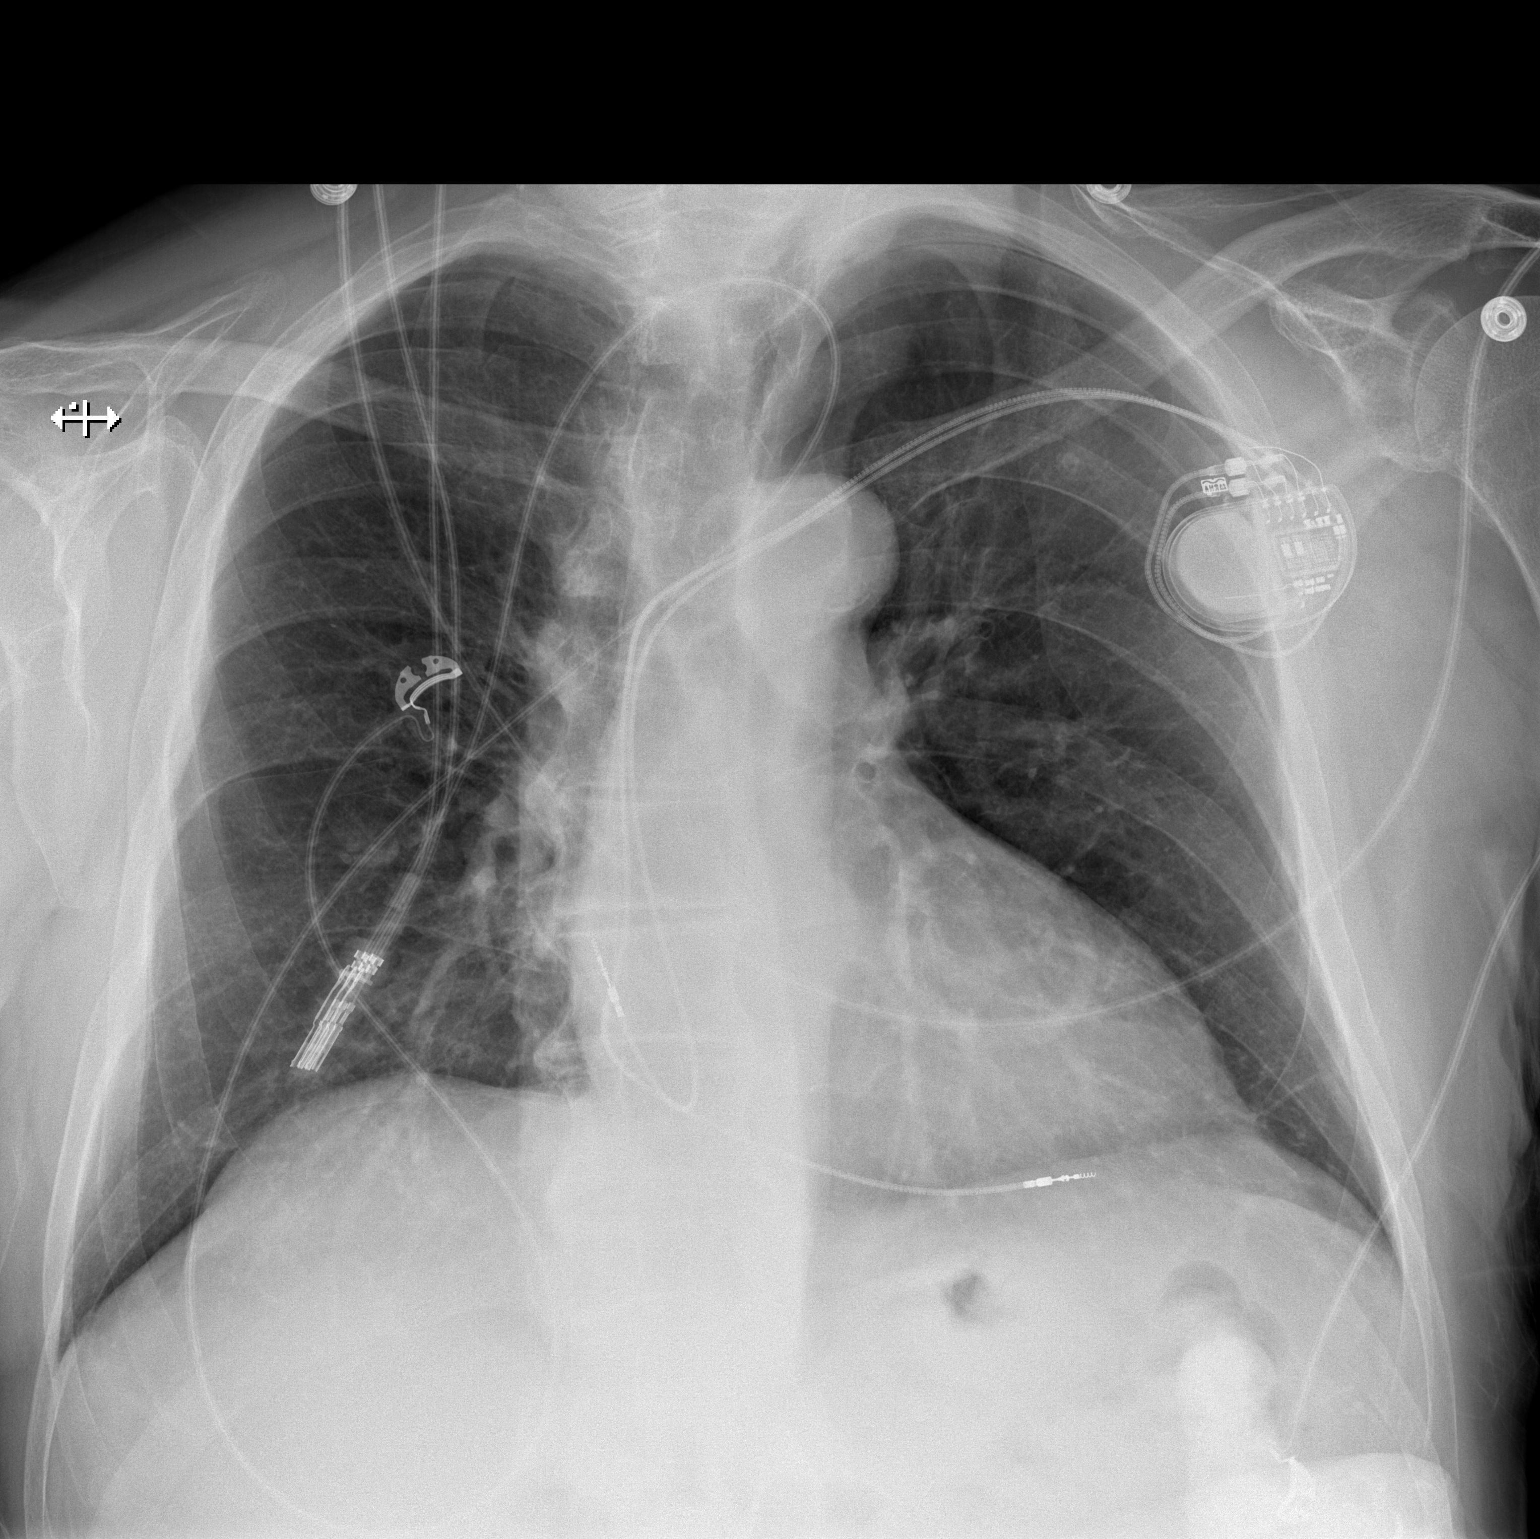

[w chest lat]
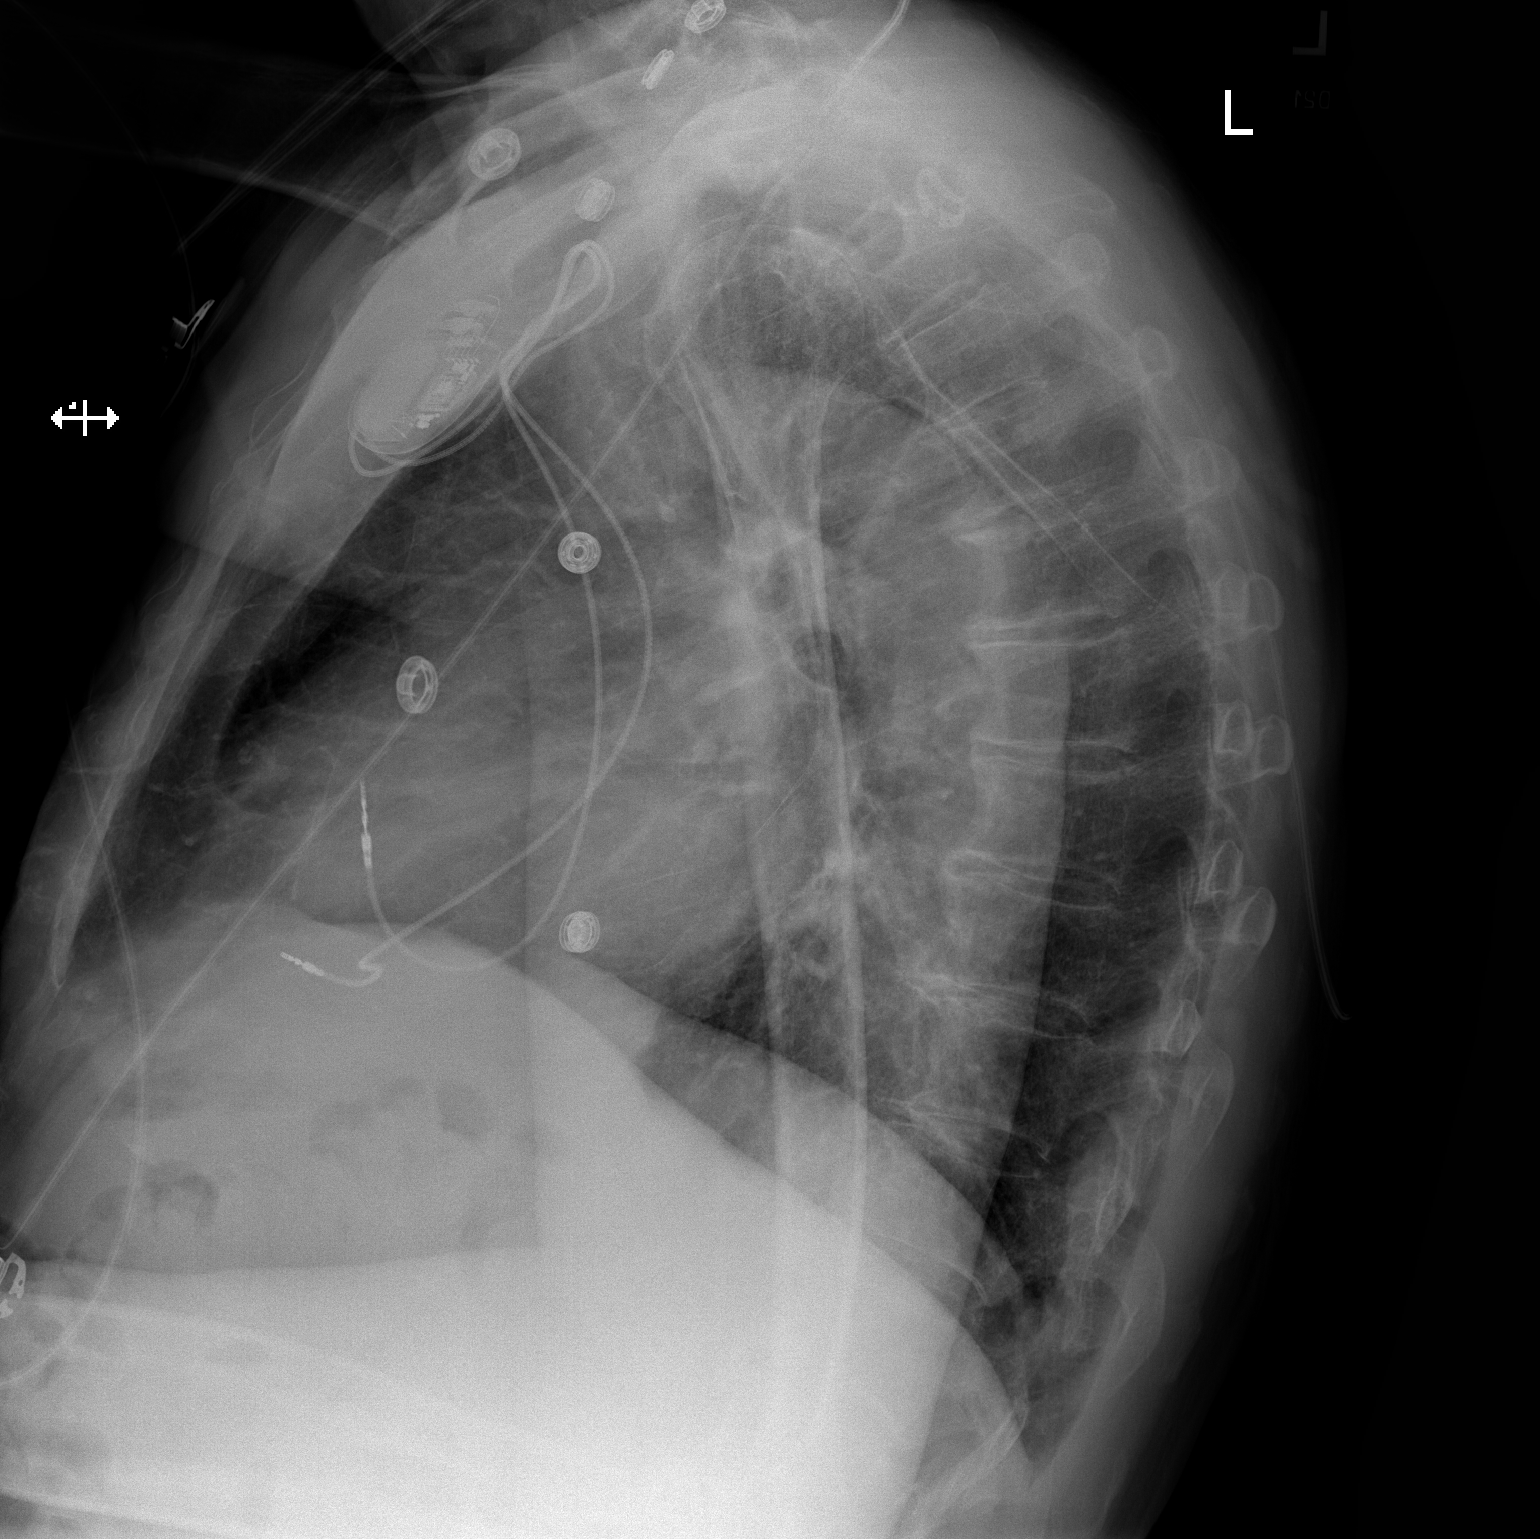

[2 of 2 positions shown; findings below may reference images not displayed]

FINDINGS: Frontal and lateral views of the chest demonstrate dual lead pacer
within the left chest wall, proximal lead in the region of the right
atrium and distal lead in the region of the right ventricle. Cardiac
silhouette is unremarkable. No airspace disease, effusion, or
pneumothorax. No acute bony abnormalities.
IMPRESSION: 1. Unremarkable dual lead pacemaker. No acute intrathoracic process.

## 2022-07-02 ENCOUNTER — Ambulatory Visit: Payer: Medicare HMO

## 2022-07-02 DIAGNOSIS — I442 Atrioventricular block, complete: Secondary | ICD-10-CM | POA: Diagnosis not present

## 2022-07-02 LAB — CUP PACEART REMOTE DEVICE CHECK
Battery Remaining Longevity: 117 mo
Battery Voltage: 3.01 V
Brady Statistic AP VP Percent: 3.71 %
Brady Statistic AP VS Percent: 0.21 %
Brady Statistic AS VP Percent: 92.09 %
Brady Statistic AS VS Percent: 3.99 %
Brady Statistic RA Percent Paced: 3.84 %
Brady Statistic RV Percent Paced: 95.81 %
Date Time Interrogation Session: 20240229204951
Implantable Lead Connection Status: 753985
Implantable Lead Connection Status: 753985
Implantable Lead Implant Date: 20220304
Implantable Lead Implant Date: 20220304
Implantable Lead Location: 753859
Implantable Lead Location: 753860
Implantable Lead Model: 5076
Implantable Lead Model: 5076
Implantable Pulse Generator Implant Date: 20220304
Lead Channel Impedance Value: 266 Ohm
Lead Channel Impedance Value: 323 Ohm
Lead Channel Impedance Value: 342 Ohm
Lead Channel Impedance Value: 418 Ohm
Lead Channel Pacing Threshold Amplitude: 0.5 V
Lead Channel Pacing Threshold Amplitude: 1 V
Lead Channel Pacing Threshold Pulse Width: 0.4 ms
Lead Channel Pacing Threshold Pulse Width: 0.4 ms
Lead Channel Sensing Intrinsic Amplitude: 2.875 mV
Lead Channel Sensing Intrinsic Amplitude: 2.875 mV
Lead Channel Sensing Intrinsic Amplitude: 5.25 mV
Lead Channel Sensing Intrinsic Amplitude: 5.25 mV
Lead Channel Setting Pacing Amplitude: 2 V
Lead Channel Setting Pacing Amplitude: 2.5 V
Lead Channel Setting Pacing Pulse Width: 0.4 ms
Lead Channel Setting Sensing Sensitivity: 1.2 mV
Zone Setting Status: 755011

## 2022-07-19 ENCOUNTER — Ambulatory Visit: Payer: Medicare HMO | Attending: Cardiology | Admitting: Cardiology

## 2022-07-19 ENCOUNTER — Encounter: Payer: Self-pay | Admitting: Cardiology

## 2022-07-19 VITALS — BP 124/84 | HR 56 | Ht 74.0 in | Wt 204.0 lb

## 2022-07-19 DIAGNOSIS — I2510 Atherosclerotic heart disease of native coronary artery without angina pectoris: Secondary | ICD-10-CM | POA: Diagnosis not present

## 2022-07-19 DIAGNOSIS — I441 Atrioventricular block, second degree: Secondary | ICD-10-CM | POA: Diagnosis not present

## 2022-07-19 DIAGNOSIS — I440 Atrioventricular block, first degree: Secondary | ICD-10-CM | POA: Diagnosis not present

## 2022-07-19 LAB — CUP PACEART INCLINIC DEVICE CHECK
Battery Remaining Longevity: 117 mo
Battery Voltage: 3.01 V
Brady Statistic AP VP Percent: 5.21 %
Brady Statistic AP VS Percent: 0.2 %
Brady Statistic AS VP Percent: 90.38 %
Brady Statistic AS VS Percent: 4.22 %
Brady Statistic RA Percent Paced: 5.34 %
Brady Statistic RV Percent Paced: 95.59 %
Date Time Interrogation Session: 20240318113501
Implantable Lead Connection Status: 753985
Implantable Lead Connection Status: 753985
Implantable Lead Implant Date: 20220304
Implantable Lead Implant Date: 20220304
Implantable Lead Location: 753859
Implantable Lead Location: 753860
Implantable Lead Model: 5076
Implantable Lead Model: 5076
Implantable Pulse Generator Implant Date: 20220304
Lead Channel Impedance Value: 304 Ohm
Lead Channel Impedance Value: 323 Ohm
Lead Channel Impedance Value: 361 Ohm
Lead Channel Impedance Value: 437 Ohm
Lead Channel Pacing Threshold Amplitude: 0.5 V
Lead Channel Pacing Threshold Amplitude: 0.875 V
Lead Channel Pacing Threshold Pulse Width: 0.4 ms
Lead Channel Pacing Threshold Pulse Width: 0.4 ms
Lead Channel Sensing Intrinsic Amplitude: 2.875 mV
Lead Channel Sensing Intrinsic Amplitude: 3.25 mV
Lead Channel Sensing Intrinsic Amplitude: 4.875 mV
Lead Channel Sensing Intrinsic Amplitude: 4.875 mV
Lead Channel Setting Pacing Amplitude: 2 V
Lead Channel Setting Pacing Amplitude: 2.5 V
Lead Channel Setting Pacing Pulse Width: 0.4 ms
Lead Channel Setting Sensing Sensitivity: 1.2 mV
Zone Setting Status: 755011

## 2022-07-19 NOTE — Patient Instructions (Addendum)
Medication Instructions:  Your physician recommends that you continue on your current medications as directed. Please refer to the Current Medication list given to you today.  *If you need a refill on your cardiac medications before your next appointment, please call your pharmacy*   Lab Work: None ordered   Testing/Procedures: None ordered   Follow-Up: At [facility], you and your health needs are our priority.  As part of our continuing mission to provide you with exceptional heart care, we have created designated Provider Care Teams.  These Care Teams include your primary Cardiologist (physician) and Advanced Practice Providers (APPs -  Physician Assistants and Nurse Practitioners) who all work together to provide you with the care you need, when you need it.  Remote monitoring is used to monitor your Pacemaker or ICD from home. This monitoring reduces the number of office visits required to check your device to one time per year. It allows [place] to keep an eye on the functioning of your device to ensure it is working properly. You are scheduled for a device check from home on 10/01/22. You may send your transmission at any time that day. If you have a wireless device, the transmission will be sent automatically. After your physician reviews your transmission, you will receive a postcard with your next transmission date.  Your next appointment:   1 year(s)  The format for your next appointment:   In Person  Provider:   Will Camnitz, MD2    Thank you for choosing [facility]!!   Trinidad Curet, RN [phone]

## 2022-07-19 NOTE — Progress Notes (Signed)
Electrophysiology Office Note   Date:  07/19/2022   ID:  William Tate, DOB 02-Dec-1950, MRN [id number]  PCP:  Maggie Schwalbe, PA-C  Cardiologist:  [place] Primary Electrophysiologist:  Peni Rupard Meredith Leeds, MD    Chief Complaint: fatigue   History of Present Illness: William Tate is a 72 y.o. male who is being seen today for the evaluation of second degree AV block at the request of Cher Nakai, MD. Presenting today for electrophysiology evaluation.  He has a history significant hyperlipidemia and type 2 diabetes.  Left heart catheterization showed mild nonobstructive coronary artery disease.  He developed worsening shortness of breath and fatigue.  He wore a cardiac monitor that showed Mobitz 1 AV block.  He is post Medtronic dual-chamber pacemaker implanted 07/04/2020.  Today, denies symptoms of palpitations, chest pain, shortness of breath, orthopnea, PND, lower extremity edema, claudication, dizziness, presyncope, syncope, bleeding, or neurologic sequela. The patient is tolerating medications without difficulties.  Since being seen he has done well.  He has no chest pain or shortness of breath.  Able do all of his daily activities without restriction.  Continue to feel well with his pacemaker.    Past Medical History:  Diagnosis Date   Anxiety    B12 deficiency anemia    Chronic fatigue    Diabetic retinopathy (HCC)    First degree AV block    GERD (gastroesophageal reflux disease)    Hypercholesterolemia    Hyperlipidemia    Kidney stone    Osteoarthrosis involving multiple sites    Recurrent major depressive disorder in partial remission ([place])    Type 2 diabetes mellitus ([place])    Vitamin D deficiency    Past Surgical History:  Procedure Laterality Date   PACEMAKER IMPLANT N/A 07/04/2020   Procedure: PACEMAKER IMPLANT;  Surgeon: Constance Haw, MD;  Location: [place] [facility];  Service: Cardiovascular;  Laterality: N/A;   REPLACEMENT TOTAL KNEE Bilateral     RIGHT/LEFT HEART CATH AND CORONARY ANGIOGRAPHY N/A 06/03/2020   Procedure: RIGHT/LEFT HEART CATH AND CORONARY ANGIOGRAPHY;  Surgeon: Leonie Man, MD;  Location: [place] [facility];  Service: Cardiovascular;  Laterality: N/A;     Current Outpatient Medications  Medication Sig Dispense Refill   glimepiride (AMARYL) 4 MG tablet Take 4 mg by mouth 2 (two) times daily.     lisinopril (ZESTRIL) 2.5 MG tablet Take 2.5 mg by mouth daily.     omeprazole (PRILOSEC) 20 MG capsule Take 20 mg by mouth daily.     vitamin B-12 (CYANOCOBALAMIN) 500 MCG tablet Take 500 mcg by mouth daily.     No current facility-administered medications for this visit.    Allergies:   Patient has no known allergies.   Social History:  The patient  reports that he quit smoking about 34 years ago. His smoking use included cigarettes. He has never used smokeless tobacco. He reports that he does not drink alcohol and does not use drugs.   Family History:  The patient's family history includes Diabetes in his brother, father, and maternal grandmother; Heart Problems in his sister; Heart disease in his brother; Hypertension in his brother, father, and mother; Stroke in his mother.   ROS:  Please see the history of present illness.   Otherwise, review of systems is positive for none.   All other systems are reviewed and negative.   PHYSICAL EXAM: VS:  BP 124/84   Pulse (!) 56   Ht 6\' 2"  (1.88 m)  Wt 204 lb (92.5 kg)   SpO2 98%   BMI 26.19 kg/m  , BMI Body mass index is 26.19 kg/m. GEN: Well nourished, well developed, in no acute distress  HEENT: normal  Neck: no JVD, carotid bruits, or masses Cardiac: RRR; no murmurs, rubs, or gallops,no edema  Respiratory:  clear to auscultation bilaterally, normal work of breathing GI: soft, nontender, nondistended, + BS MS: no deformity or atrophy  Skin: warm and dry, device site well healed Neuro:  Strength and sensation are intact Psych: euthymic mood, full affect  EKG:   EKG is ordered today. Personal review of the ekg ordered shows sinus rhythm, ventricular paced  Personal review of the device interrogation today. Results in [place]: No results found for requested labs within last 365 days.    Lipid Panel     Component Value Date/Time   CHOL 149 09/17/2020 0811   TRIG 56 09/17/2020 0811   HDL 64 09/17/2020 0811   CHOLHDL 2.3 09/17/2020 0811   LDLCALC 73 09/17/2020 0811     Wt Readings from Last 3 Encounters:  07/19/22 204 lb (92.5 kg)  03/24/22 201 lb (91.2 kg)  03/19/22 203 lb (92.1 kg)      Other studies Reviewed: Additional studies/ records that were reviewed today include: LHC 06/03/20  Review of the above records today demonstrates:  The left ventricular systolic function is normal. The left ventricular ejection fraction is 55-65% by visual estimate. LV end diastolic pressure is normal. Normal Right Heart Cath Numbers-Cardiac Output Index and Pressures. Mid LAD lesion is 20% stenosed. Otherwise minimal CAD.  Cardiac monitor 06/16/2020 personally reviewed Mobitz 1 second-degree AV block with episodes of SVT    ASSESSMENT AND PLAN:  1.  Mobitz 1 AV block: Was having shortness of breath and fatigue.  Is status post Medtronic dual-chamber pacemaker implanted 07/04/2020.  Device functioning appropriately.  Threshold, impedance, sensing within normal limits and stable.  No changes.  2.  Coronary artery disease: Mild luminal irregularities.  No current chest pain.  3.  Hyperlipidemia: Continue Lipitor per primary cardiology  Current medicines are reviewed at length with the patient today.   The patient does not have concerns regarding his medicines.  The following changes were made today: None  Labs/ tests ordered today include:  Orders Placed This Encounter  Procedures   EKG 12-Lead      Disposition:   FU 12 months  Signed, Sarahjane Matherly Meredith Leeds, MD  07/19/2022 11:12 AM     [facility] [address] [place] [address] [phone] (office) [phone] (fax)

## 2022-07-30 NOTE — Progress Notes (Signed)
Remote pacemaker transmission.   

## 2022-08-04 ENCOUNTER — Telehealth: Payer: Self-pay

## 2022-08-04 NOTE — Telephone Encounter (Signed)
   Patient Name: William Tate  DOB: 1950/11/12 MRN: [id number]  Primary Cardiologist: Berniece Salines, DO  Chart reviewed as part of pre-operative protocol coverage. Given past medical history and time since last visit, based on ACC/AHA guidelines, [name] is at acceptable risk for the planned procedure. Pacemaker functioning appropriately.  Able to do all of his activities without restriction.  Intermediate risk for an intermediate risk procedure.  No further cardiac testing is necessary.   The patient was advised that if he develops new symptoms prior to surgery to contact our office to arrange for a follow-up visit, and he verbalized understanding.  I will route this recommendation to the requesting party via Epic fax function and remove from pre-op pool.  Please call with questions.  Mable Fill, Marissa Nestle, NP 08/04/2022, 12:02 PM

## 2022-08-04 NOTE — Telephone Encounter (Signed)
Dr. Curt Bears,  William Tate is scheduled to undergo a total knee replacement and has requested surgical clearance.  He was seen in clinic by you recently on 07/19/2022.  Can you please comment on surgical clearance for his scheduled knee replacement procedure.  Please route your recommendations to P CV DIV PREOP.  Thank you, Ambrose Pancoast, NP

## 2022-08-04 NOTE — Telephone Encounter (Signed)
   [facility] Pre-operative Risk Assessment    Request for surgical clearance:  What type of surgery is being performed? Left Total Knee Arthroscopy   When is this surgery scheduled? TBD   What type of clearance is required (medical clearance vs. Pharmacy clearance to hold med vs. Both)? Both  Are there any medications that need to be held prior to surgery and how long?Not specified   Practice name and name of physician performing surgery? Dr. Creig Hines at [facility].   What is your office phone number: [phone]    7.   What is your office fax number: [phone]  8.   Anesthesia type (None, local, MAC, general) ? General Anesthesia.   Basil Dess Nakesha Ebrahim 08/04/2022, 9:39 AM  _________________________________________________________________   (provider comments below)

## 2022-08-12 DIAGNOSIS — Z01818 Encounter for other preprocedural examination: Secondary | ICD-10-CM | POA: Diagnosis not present

## 2022-08-27 DIAGNOSIS — F410 Panic disorder [episodic paroxysmal anxiety] without agoraphobia: Secondary | ICD-10-CM | POA: Insufficient documentation

## 2022-08-27 DIAGNOSIS — F411 Generalized anxiety disorder: Secondary | ICD-10-CM | POA: Insufficient documentation

## 2022-08-27 DIAGNOSIS — I10 Essential (primary) hypertension: Secondary | ICD-10-CM | POA: Insufficient documentation

## 2022-10-01 ENCOUNTER — Ambulatory Visit (INDEPENDENT_AMBULATORY_CARE_PROVIDER_SITE_OTHER): Payer: Medicare HMO

## 2022-10-01 DIAGNOSIS — I441 Atrioventricular block, second degree: Secondary | ICD-10-CM | POA: Diagnosis not present

## 2022-10-04 LAB — CUP PACEART REMOTE DEVICE CHECK
Battery Remaining Longevity: 113 mo
Battery Voltage: 3.01 V
Brady Statistic AP VP Percent: 4.01 %
Brady Statistic AP VS Percent: 0.31 %
Brady Statistic AS VP Percent: 91.88 %
Brady Statistic AS VS Percent: 3.8 %
Brady Statistic RA Percent Paced: 4.24 %
Brady Statistic RV Percent Paced: 95.89 %
Date Time Interrogation Session: 20240531062108
Implantable Lead Connection Status: 753985
Implantable Lead Connection Status: 753985
Implantable Lead Implant Date: 20220304
Implantable Lead Implant Date: 20220304
Implantable Lead Location: 753859
Implantable Lead Location: 753860
Implantable Lead Model: 5076
Implantable Lead Model: 5076
Implantable Pulse Generator Implant Date: 20220304
Lead Channel Impedance Value: 304 Ohm
Lead Channel Impedance Value: 323 Ohm
Lead Channel Impedance Value: 342 Ohm
Lead Channel Impedance Value: 418 Ohm
Lead Channel Pacing Threshold Amplitude: 0.5 V
Lead Channel Pacing Threshold Amplitude: 0.875 V
Lead Channel Pacing Threshold Pulse Width: 0.4 ms
Lead Channel Pacing Threshold Pulse Width: 0.4 ms
Lead Channel Sensing Intrinsic Amplitude: 3.375 mV
Lead Channel Sensing Intrinsic Amplitude: 3.375 mV
Lead Channel Sensing Intrinsic Amplitude: 4.625 mV
Lead Channel Sensing Intrinsic Amplitude: 4.625 mV
Lead Channel Setting Pacing Amplitude: 2 V
Lead Channel Setting Pacing Amplitude: 2.5 V
Lead Channel Setting Pacing Pulse Width: 0.4 ms
Lead Channel Setting Sensing Sensitivity: 1.2 mV
Zone Setting Status: 755011

## 2022-10-19 NOTE — Progress Notes (Signed)
Remote pacemaker transmission.   

## 2022-10-22 ENCOUNTER — Encounter: Payer: Self-pay | Admitting: Cardiology

## 2022-10-22 ENCOUNTER — Ambulatory Visit: Payer: Medicare HMO | Attending: Cardiology | Admitting: Cardiology

## 2022-10-22 VITALS — BP 110/70 | HR 93 | Ht 74.0 in | Wt 194.4 lb

## 2022-10-22 DIAGNOSIS — I2510 Atherosclerotic heart disease of native coronary artery without angina pectoris: Secondary | ICD-10-CM

## 2022-10-22 DIAGNOSIS — I441 Atrioventricular block, second degree: Secondary | ICD-10-CM

## 2022-10-22 DIAGNOSIS — E782 Mixed hyperlipidemia: Secondary | ICD-10-CM

## 2022-10-22 DIAGNOSIS — E1100 Type 2 diabetes mellitus with hyperosmolarity without nonketotic hyperglycemic-hyperosmolar coma (NKHHC): Secondary | ICD-10-CM

## 2022-10-22 DIAGNOSIS — Z950 Presence of cardiac pacemaker: Secondary | ICD-10-CM

## 2022-10-22 DIAGNOSIS — F410 Panic disorder [episodic paroxysmal anxiety] without agoraphobia: Secondary | ICD-10-CM

## 2022-10-22 MED ORDER — SILDENAFIL CITRATE 100 MG PO TABS: 100.0000 mg | ORAL_TABLET | Freq: Every day | ORAL | 6 refills | Status: DC | PRN

## 2022-10-22 NOTE — Addendum Note (Signed)
Addended by: Baldo Ash D on: 10/22/2022 02:12 PM   Modules accepted: Orders

## 2022-10-22 NOTE — Patient Instructions (Addendum)
Medication Instructions:  None     Lab Work: None  If you have labs (blood work) drawn today and your tests are completely normal, you will receive your results only by: MyChart Message (if you have MyChart) OR A paper copy in the mail If you have any lab test that is abnormal or we need to change your treatment, we will call you to review the results.   Testing/Procedures: None Ordered   Follow-Up: At [facility], you and your health needs are our priority.  As part of our continuing mission to provide you with exceptional heart care, we have created designated Provider Care Teams.  These Care Teams include your primary Cardiologist (physician) and Advanced Practice Providers (APPs -  Physician Assistants and Nurse Practitioners) who all work together to provide you with the care you need, when you need it.  We recommend signing up for the patient portal called "MyChart".  Sign up information is provided on this After Visit Summary.  MyChart is used to connect with patients for Virtual Visits (Telemedicine).  Patients are able to view lab/test results, encounter notes, upcoming appointments, etc.  Non-urgent messages can be sent to your provider as well.   To learn more about what you can do with MyChart, go to [email or web address].    Your next appointment:   6 months   The format for your next appointment:   In Person  Provider:   Gypsy Balsam, MD    Other Instructions NA

## 2022-10-22 NOTE — Progress Notes (Signed)
Cardiology Office Note:    Date:  10/22/2022   ID:  [name], DOB 02-04-1951, MRN [id number]  PCP:  Leane Call, PA-C  Cardiologist:  Gypsy Balsam, MD    Referring MD: Leane Call, PA-C   Chief Complaint  Patient presents with   Follow-up  Doing well  History of Present Illness:    William Tate is a 72 y.o. male past medical history significant for coronary artery disease he did have cardiac catheterization February 2022 which showed minimal coronary artery disease in form of up to 20% stenosis in mid LAD, right side cardiac catheterization was normal he was also found to have second-degree type II AV block and pacemaker has been inserted additional problem include dyslipidemia, essential hypertension, diabetes, depression, anxiety. Comes today to months for follow-up overall doing very well.  He keep walking 3 times a day and have no difficulty doing it.  About 4 weeks ago so he got right knee replacement surgery done.  Initially was very painful but now getting better he started exercising on the regular basis came  Past Medical History:  Diagnosis Date   Anxiety    B12 deficiency anemia    Chronic fatigue    Diabetic retinopathy (HCC)    First degree AV block    GERD (gastroesophageal reflux disease)    Hypercholesterolemia    Hyperlipidemia    Kidney stone    Osteoarthrosis involving multiple sites    Recurrent major depressive disorder in partial remission (HCC)    Type 2 diabetes mellitus (HCC)    Vitamin D deficiency     Past Surgical History:  Procedure Laterality Date   PACEMAKER IMPLANT N/A 07/04/2020   Procedure: PACEMAKER IMPLANT;  Surgeon: Regan Lemming, MD;  Location: [facility];  Service: Cardiovascular;  Laterality: N/A;   REPLACEMENT TOTAL KNEE Bilateral    RIGHT/LEFT HEART CATH AND CORONARY ANGIOGRAPHY N/A 06/03/2020   Procedure: RIGHT/LEFT HEART CATH AND CORONARY ANGIOGRAPHY;  Surgeon: Marykay Lex, MD;   Location: [facility];  Service: Cardiovascular;  Laterality: N/A;    Current Medications: Current Meds  Medication Sig   aspirin EC 81 MG tablet Take 81 mg by mouth daily. Swallow whole.   atorvastatin (LIPITOR) 10 MG tablet Take 10 mg by mouth daily.   buPROPion (WELLBUTRIN SR) 150 MG 12 hr tablet Take 150 mg by mouth daily.   citalopram (CELEXA) 20 MG tablet Take 20 mg by mouth daily.   Cyanocobalamin (VITAMIN B-12) 5000 MCG TBDP Take 1 tablet by mouth daily.   Ergocalciferol (VITAMIN D2) 50 MCG (2000 UT) TABS Take 1 tablet by mouth daily.   glimepiride (AMARYL) 4 MG tablet Take 4 mg by mouth 2 (two) times daily.   hydrochlorothiazide (HYDRODIURIL) 12.5 MG tablet Take 12.5 mg by mouth daily.   lisinopril (ZESTRIL) 2.5 MG tablet Take 2.5 mg by mouth daily.   metFORMIN (GLUCOPHAGE) 850 MG tablet Take 850 mg by mouth 3 (three) times daily.   omeprazole (PRILOSEC) 20 MG capsule Take 20 mg by mouth daily.     Allergies:   Patient has no known allergies.   Social History   Socioeconomic History   Marital status: Married    Spouse name: Not on file   Number of children: Not on file   Years of education: Not on file   Highest education level: Not on file  Occupational History   Not on file  Tobacco Use   Smoking status: Former  Types: Cigarettes    Quit date: 34    Years since quitting: 34.4   Smokeless tobacco: Never  Vaping Use   Vaping Use: Never used  Substance and Sexual Activity   Alcohol use: Never   Drug use: Never   Sexual activity: Not on file  Other Topics Concern   Not on file  Social History Narrative   Not on file   Social Determinants of Health   Financial Resource Strain: Not on file  Food Insecurity: Not on file  Transportation Needs: Not on file  Physical Activity: Not on file  Stress: Not on file  Social Connections: Not on file     Family History: The patient's family history includes Diabetes in his brother, father, and maternal  grandmother; Heart Problems in his sister; Heart disease in his brother; Hypertension in his brother, father, and mother; Stroke in his mother. ROS:   Please see the history of present illness.    All 14 point review of systems negative except as described per history of present illness  EKGs/Labs/Other Studies Reviewed:      Recent Labs: No results found for requested labs within last 365 days.  Recent Lipid Panel    Component Value Date/Time   CHOL 149 09/17/2020 0811   TRIG 56 09/17/2020 0811   HDL 64 09/17/2020 0811   CHOLHDL 2.3 09/17/2020 0811   LDLCALC 73 09/17/2020 0811    Physical Exam:    VS:  BP 110/70 (BP Location: Left Arm, Patient Position: Sitting)   Pulse 93   Ht 6\' 2"  (1.88 m)   Wt 194 lb 6.4 oz (88.2 kg)   SpO2 96%   BMI 24.96 kg/m     Wt Readings from Last 3 Encounters:  10/22/22 194 lb 6.4 oz (88.2 kg)  07/19/22 204 lb (92.5 kg)  03/24/22 201 lb (91.2 kg)     GEN:  Well nourished, well developed in no acute distress HEENT: Normal NECK: No JVD; No carotid bruits LYMPHATICS: No lymphadenopathy CARDIAC: RRR, no murmurs, no rubs, no gallops RESPIRATORY:  Clear to auscultation without rales, wheezing or rhonchi  ABDOMEN: Soft, non-tender, non-distended MUSCULOSKELETAL:  No edema; No deformity  SKIN: Warm and dry LOWER EXTREMITIES: no swelling NEUROLOGIC:  Alert and oriented x 3 PSYCHIATRIC:  Normal affect   ASSESSMENT:    1. Atrioventricular block, Mobitz type 1, Wenckebach   2. Type 2 diabetes mellitus with hyperosmolarity without coma, without long-term current use of insulin (HCC)   3. Coronary artery disease involving native coronary artery of native heart without angina pectoris   4. Mixed hyperlipidemia   5. Pacemaker   6. Panic attacks    PLAN:    In order of problems listed above:  Coronary artery disease minimal disease on appropriate guideline directed medical therapy which I will continue that include statin as well as  antiplatelet therapy in form of aspirin. Type 2 diabetes that being followed by internal medicine team I do have last fasting lipid profile/A1C which was 7.0 from last summer. Dyslipidemia he is taking Lipitor 10 which I will continue LDL 80 HDL 65 continue present management. Pacemaker present I review interrogation, normal function   Medication Adjustments/Labs and Tests Ordered: Current medicines are reviewed at length with the patient today.  Concerns regarding medicines are outlined above.  No orders of the defined types were placed in this encounter.  Medication changes: No orders of the defined types were placed in this encounter.   Signed, Georgeanna Lea,  MD, [facility] 10/22/2022 1:47 PM    [facility]

## 2022-10-22 NOTE — Addendum Note (Signed)
Addended by: Baldo Ash D on: 10/22/2022 02:22 PM   Modules accepted: Orders

## 2022-12-31 ENCOUNTER — Ambulatory Visit (INDEPENDENT_AMBULATORY_CARE_PROVIDER_SITE_OTHER): Payer: Medicare HMO

## 2022-12-31 DIAGNOSIS — I441 Atrioventricular block, second degree: Secondary | ICD-10-CM

## 2022-12-31 LAB — CUP PACEART REMOTE DEVICE CHECK
Battery Remaining Longevity: 109 mo
Battery Voltage: 3.01 V
Brady Statistic AP VP Percent: 3.44 %
Brady Statistic AP VS Percent: 0.47 %
Brady Statistic AS VP Percent: 91.42 %
Brady Statistic AS VS Percent: 4.67 %
Brady Statistic RA Percent Paced: 3.83 %
Brady Statistic RV Percent Paced: 94.86 %
Date Time Interrogation Session: 20240829202947
Implantable Lead Connection Status: 753985
Implantable Lead Connection Status: 753985
Implantable Lead Implant Date: 20220304
Implantable Lead Implant Date: 20220304
Implantable Lead Location: 753859
Implantable Lead Location: 753860
Implantable Lead Model: 5076
Implantable Lead Model: 5076
Implantable Pulse Generator Implant Date: 20220304
Lead Channel Impedance Value: 266 Ohm
Lead Channel Impedance Value: 304 Ohm
Lead Channel Impedance Value: 342 Ohm
Lead Channel Impedance Value: 399 Ohm
Lead Channel Pacing Threshold Amplitude: 0.375 V
Lead Channel Pacing Threshold Amplitude: 1 V
Lead Channel Pacing Threshold Pulse Width: 0.4 ms
Lead Channel Pacing Threshold Pulse Width: 0.4 ms
Lead Channel Sensing Intrinsic Amplitude: 2.875 mV
Lead Channel Sensing Intrinsic Amplitude: 2.875 mV
Lead Channel Sensing Intrinsic Amplitude: 5 mV
Lead Channel Sensing Intrinsic Amplitude: 5 mV
Lead Channel Setting Pacing Amplitude: 2 V
Lead Channel Setting Pacing Amplitude: 2.5 V
Lead Channel Setting Pacing Pulse Width: 0.4 ms
Lead Channel Setting Sensing Sensitivity: 1.2 mV
Zone Setting Status: 755011

## 2023-01-04 NOTE — Progress Notes (Signed)
Remote pacemaker transmission.   

## 2023-04-01 ENCOUNTER — Ambulatory Visit (INDEPENDENT_AMBULATORY_CARE_PROVIDER_SITE_OTHER): Payer: Medicare HMO

## 2023-04-01 DIAGNOSIS — I441 Atrioventricular block, second degree: Secondary | ICD-10-CM | POA: Diagnosis not present

## 2023-04-01 LAB — CUP PACEART REMOTE DEVICE CHECK
Battery Remaining Longevity: 113 mo
Battery Voltage: 3.01 V
Brady Statistic AP VP Percent: 2.05 %
Brady Statistic AP VS Percent: 0.3 %
Brady Statistic AS VP Percent: 92.23 %
Brady Statistic AS VS Percent: 5.42 %
Brady Statistic RA Percent Paced: 2.3 %
Brady Statistic RV Percent Paced: 94.28 %
Date Time Interrogation Session: 20241128210445
Implantable Lead Connection Status: 753985
Implantable Lead Connection Status: 753985
Implantable Lead Implant Date: 20220304
Implantable Lead Implant Date: 20220304
Implantable Lead Location: 753859
Implantable Lead Location: 753860
Implantable Lead Model: 5076
Implantable Lead Model: 5076
Implantable Pulse Generator Implant Date: 20220304
Lead Channel Impedance Value: 285 Ohm
Lead Channel Impedance Value: 323 Ohm
Lead Channel Impedance Value: 361 Ohm
Lead Channel Impedance Value: 513 Ohm
Lead Channel Pacing Threshold Amplitude: 0.5 V
Lead Channel Pacing Threshold Amplitude: 0.875 V
Lead Channel Pacing Threshold Pulse Width: 0.4 ms
Lead Channel Pacing Threshold Pulse Width: 0.4 ms
Lead Channel Sensing Intrinsic Amplitude: 3.125 mV
Lead Channel Sensing Intrinsic Amplitude: 3.125 mV
Lead Channel Sensing Intrinsic Amplitude: 4.75 mV
Lead Channel Sensing Intrinsic Amplitude: 4.75 mV
Lead Channel Setting Pacing Amplitude: 2 V
Lead Channel Setting Pacing Amplitude: 2.5 V
Lead Channel Setting Pacing Pulse Width: 0.4 ms
Lead Channel Setting Sensing Sensitivity: 1.2 mV
Zone Setting Status: 755011

## 2023-04-22 NOTE — Progress Notes (Signed)
Remote pacemaker transmission.   

## 2023-06-17 ENCOUNTER — Encounter: Payer: Self-pay | Admitting: Cardiology

## 2023-06-17 ENCOUNTER — Ambulatory Visit: Payer: Medicare HMO | Attending: Cardiology | Admitting: Cardiology

## 2023-06-17 VITALS — BP 130/68 | HR 63 | Ht 74.0 in | Wt 204.0 lb

## 2023-06-17 DIAGNOSIS — Z950 Presence of cardiac pacemaker: Secondary | ICD-10-CM

## 2023-06-17 DIAGNOSIS — I10 Essential (primary) hypertension: Secondary | ICD-10-CM

## 2023-06-17 DIAGNOSIS — E1100 Type 2 diabetes mellitus with hyperosmolarity without nonketotic hyperglycemic-hyperosmolar coma (NKHHC): Secondary | ICD-10-CM

## 2023-06-17 DIAGNOSIS — I2510 Atherosclerotic heart disease of native coronary artery without angina pectoris: Secondary | ICD-10-CM | POA: Diagnosis not present

## 2023-06-17 NOTE — Patient Instructions (Signed)

## 2023-06-17 NOTE — Progress Notes (Signed)
Cardiology Office Note:    Date:  06/17/2023   ID:  William Tate, DOB [birth year], MRN [id number]  PCP:  Leane Call, PA-C  Cardiologist:  Gypsy Balsam, MD    Referring MD: Leane Call, PA-C   Chief Complaint  Patient presents with   Follow-up  Doing well   History of Present Illness:   William Tate is a 73 y.o. male   past medical history significant for coronary artery disease he did have cardiac catheterization February 2022 which showed minimal coronary artery disease in form of up to 20% stenosis in mid LAD, right side cardiac catheterization was normal he was also found to have second-degree type II AV block and pacemaker has been inserted additional problem include dyslipidemia, essential hypertension, diabetes, depression, anxiety.   Doing well, had catharact surgere and left knee replacement, no problems. NO CP, SOB, swelling   Past Medical History:  Diagnosis Date   Anxiety    B12 deficiency anemia    Chronic fatigue    Diabetic retinopathy (HCC)    First degree AV block    GERD (gastroesophageal reflux disease)    Hypercholesterolemia    Hyperlipidemia    Kidney stone    Osteoarthrosis involving multiple sites    Recurrent major depressive disorder in partial remission (HCC)    Type 2 diabetes mellitus (HCC)    Vitamin D deficiency     Past Surgical History:  Procedure Laterality Date   PACEMAKER IMPLANT N/A 07/04/2020   Procedure: PACEMAKER IMPLANT;  Surgeon: Regan Lemming, MD;  Location: [facility];  Service: Cardiovascular;  Laterality: N/A;   REPLACEMENT TOTAL KNEE Bilateral    RIGHT/LEFT HEART CATH AND CORONARY ANGIOGRAPHY N/A 06/03/2020   Procedure: RIGHT/LEFT HEART CATH AND CORONARY ANGIOGRAPHY;  Surgeon: Marykay Lex, MD;  Location: [facility] INVASIVE [facility];  Service: Cardiovascular;  Laterality: N/A;    Current Medications: Current Meds  Medication Sig   aspirin EC 81 MG tablet Take 81 mg by mouth daily. Swallow  whole.   atorvastatin (LIPITOR) 10 MG tablet Take 10 mg by mouth daily.   buPROPion (WELLBUTRIN SR) 150 MG 12 hr tablet Take 150 mg by mouth daily.   citalopram (CELEXA) 20 MG tablet Take 20 mg by mouth daily.   Cyanocobalamin (VITAMIN B-12) 5000 MCG TBDP Take 1 tablet by mouth daily.   Ergocalciferol (VITAMIN D2) 50 MCG (2000 UT) TABS Take 1 tablet by mouth daily.   glimepiride (AMARYL) 4 MG tablet Take 4 mg by mouth 2 (two) times daily.   hydrochlorothiazide (HYDRODIURIL) 12.5 MG tablet Take 12.5 mg by mouth daily.   lisinopril (ZESTRIL) 2.5 MG tablet Take 2.5 mg by mouth daily.   metFORMIN (GLUCOPHAGE) 850 MG tablet Take 850 mg by mouth 3 (three) times daily.   omeprazole (PRILOSEC) 20 MG capsule Take 20 mg by mouth daily.     Allergies:   Patient has no known allergies.   Social History   Socioeconomic History   Marital status: Married    Spouse name: Not on file   Number of children: Not on file   Years of education: Not on file   Highest education level: Not on file  Occupational History   Not on file  Tobacco Use   Smoking status: Former    Current packs/day: 0.00    Types: Cigarettes    Quit date: 1990    Years since quitting: 35.1   Smokeless tobacco: Never  Vaping Use  Vaping status: Never Used  Substance and Sexual Activity   Alcohol use: Never   Drug use: Never   Sexual activity: Not on file  Other Topics Concern   Not on file  Social History Narrative   Not on file   Social Drivers of Health   Financial Resource Strain: Not on file  Food Insecurity: Low Risk  (08/21/2022)   [facility]   Hunger Vital Sign    Worried About Running Out of Food in the Last Year: Never true    Ran Out of Food in the Last Year: Never true  Transportation Needs: No Transportation Needs (08/21/2022)   [facility]   Transportation    In the past 12 months, has lack of reliable transportation kept you from medical  appointments, meetings, work or from getting things needed for daily living? : No  Physical Activity: Not on file  Stress: Not on file  Social Connections: Not on file     Family History: The patient's family history includes Diabetes in his brother, father, and maternal grandmother; Heart Problems in his sister; Heart disease in his brother; Hypertension in his brother, father, and mother; Stroke in his mother. ROS:   Please see the history of present illness.    All 14 point review of systems negative except as described per history of present illness  EKGs/Labs/Other Studies Reviewed:         Recent Labs: No results found for requested labs within last 365 days.  Recent Lipid Panel    Component Value Date/Time   CHOL 149 09/17/2020 0811   TRIG 56 09/17/2020 0811   HDL 64 09/17/2020 0811   CHOLHDL 2.3 09/17/2020 0811   LDLCALC 73 09/17/2020 0811    Physical Exam:    VS:  BP 130/68 (BP Location: Right Arm, Patient Position: Sitting)   Pulse 63   Ht 6\' 2"  (1.88 m)   Wt 204 lb (92.5 kg)   SpO2 99%   BMI 26.19 kg/m     Wt Readings from Last 3 Encounters:  06/17/23 204 lb (92.5 kg)  10/22/22 194 lb 6.4 oz (88.2 kg)  07/19/22 204 lb (92.5 kg)     GEN:  Well nourished, well developed in no acute distress HEENT: Normal NECK: No JVD; No carotid bruits LYMPHATICS: No lymphadenopathy CARDIAC: RRR, no murmurs, no rubs, no gallops RESPIRATORY:  Clear to auscultation without rales, wheezing or rhonchi  ABDOMEN: Soft, non-tender, non-distended MUSCULOSKELETAL:  No edema; No deformity  SKIN: Warm and dry LOWER EXTREMITIES: no swelling NEUROLOGIC:  Alert and oriented x 3 PSYCHIATRIC:  Normal affect   ASSESSMENT:    1. Coronary artery disease involving native coronary artery of native heart without angina pectoris   2. Primary hypertension   3. Type 2 diabetes mellitus with hyperosmolarity without coma, without long-term current use of insulin (HCC)   4. Pacemaker     PLAN:    In order of problems listed above:  CAD - stable, on appropriate meds, will continue.  HTN - well controlled.  PPM - stabe, reviewed, doing well  DM2 - HGA1C    Medication Adjustments/Labs and Tests Ordered: Current medicines are reviewed at length with the patient today.  Concerns regarding medicines are outlined above.  Orders Placed This Encounter  Procedures   EKG 12-Lead   Medication changes: No orders of the defined types were placed in this encounter.   Signed, Georgeanna Lea, MD, [facility] 06/17/2023  10:50 AM    [facility]

## 2023-07-01 ENCOUNTER — Ambulatory Visit (INDEPENDENT_AMBULATORY_CARE_PROVIDER_SITE_OTHER): Payer: Medicare HMO

## 2023-07-01 DIAGNOSIS — I441 Atrioventricular block, second degree: Secondary | ICD-10-CM | POA: Diagnosis not present

## 2023-07-04 LAB — CUP PACEART REMOTE DEVICE CHECK
Battery Remaining Longevity: 113 mo
Battery Voltage: 3.01 V
Brady Statistic AP VP Percent: 2.03 %
Brady Statistic AP VS Percent: 0.26 %
Brady Statistic AS VP Percent: 93.71 %
Brady Statistic AS VS Percent: 4 %
Brady Statistic RA Percent Paced: 2.25 %
Brady Statistic RV Percent Paced: 95.74 %
Date Time Interrogation Session: 20250227211519
Implantable Lead Connection Status: 753985
Implantable Lead Connection Status: 753985
Implantable Lead Implant Date: 20220304
Implantable Lead Implant Date: 20220304
Implantable Lead Location: 753859
Implantable Lead Location: 753860
Implantable Lead Model: 5076
Implantable Lead Model: 5076
Implantable Pulse Generator Implant Date: 20220304
Lead Channel Impedance Value: 266 Ohm
Lead Channel Impedance Value: 342 Ohm
Lead Channel Impedance Value: 380 Ohm
Lead Channel Impedance Value: 589 Ohm
Lead Channel Pacing Threshold Amplitude: 0.5 V
Lead Channel Pacing Threshold Amplitude: 1 V
Lead Channel Pacing Threshold Pulse Width: 0.4 ms
Lead Channel Pacing Threshold Pulse Width: 0.4 ms
Lead Channel Sensing Intrinsic Amplitude: 2.75 mV
Lead Channel Sensing Intrinsic Amplitude: 2.75 mV
Lead Channel Sensing Intrinsic Amplitude: 5.375 mV
Lead Channel Sensing Intrinsic Amplitude: 5.375 mV
Lead Channel Setting Pacing Amplitude: 2 V
Lead Channel Setting Pacing Amplitude: 2.5 V
Lead Channel Setting Pacing Pulse Width: 0.4 ms
Lead Channel Setting Sensing Sensitivity: 1.2 mV
Zone Setting Status: 755011

## 2023-07-29 NOTE — Addendum Note (Signed)
 Addended by: Elease Etienne A on: 07/29/2023 11:39 AM   Modules accepted: Orders

## 2023-07-29 NOTE — Progress Notes (Signed)
 Remote pacemaker transmission.

## 2023-09-18 NOTE — Progress Notes (Signed)
  Electrophysiology Office Note:   Date:  09/18/2023  ID:  William Tate, DOB December 15, 1950, MRN [id number]  Primary Cardiologist: Jerryl Morin, DO Primary Heart Failure: None Electrophysiologist: Uliana Brinker [place] Ding, MD      History of Present Illness:   William Tate is a 73 y.o. male with h/o second-degree AV block, hyperlipidemia, diabetes seen today for routine electrophysiology followup.   Since last being seen in our clinic the patient reports doing well.  He has no acute complaints.  He is able to do all of his daily activities without restriction.  He is unaware of any major issues at this time.  he denies chest pain, palpitations, dyspnea, PND, orthopnea, nausea, vomiting, dizziness, syncope, edema, weight gain, or early satiety.   Review of systems complete and found to be negative unless listed in HPI.      EP Information / Studies Reviewed:    EKG is not ordered today. EKG from 06/17/2023 reviewed which showed atrial sensed, ventricular paced      PPM Interrogation-  reviewed in detail today,  See PACEART report.  Device History: Medtronic Dual Chamber PPM implanted 07/04/2020 for Second Degree AV block  Risk Assessment/Calculations:             Physical Exam:   VS:  There were no vitals taken for this visit.   Wt Readings from Last 3 Encounters:  06/17/23 204 lb (92.5 kg)  10/22/22 194 lb 6.4 oz (88.2 kg)  07/19/22 204 lb (92.5 kg)     GEN: Well nourished, well developed in no acute distress NECK: No JVD; No carotid bruits CARDIAC: Regular rate and rhythm, no murmurs, rubs, gallops RESPIRATORY:  Clear to auscultation without rales, wheezing or rhonchi  ABDOMEN: Soft, non-tender, non-distended EXTREMITIES:  No edema; No deformity   ASSESSMENT AND PLAN:    Second Degree AV block s/p Medtronic PPM  Normal PPM function See Pace Art report Sensing, threshold, impedance within normal limits Programming reviewed and appropriate No changes today  2.  Coronary  artery disease: Mild luminal irregularities.  No current chest pain  3.  Hyperlipidemia: Continue Lipitor per primary cardiology  Disposition:   Follow up with Dr. Lawana Pray in 12 months  Signed, Arlan Birks Cortland Ding, MD

## 2023-09-19 ENCOUNTER — Encounter: Payer: Self-pay | Admitting: Cardiology

## 2023-09-19 ENCOUNTER — Ambulatory Visit: Payer: Medicare HMO | Attending: Cardiology | Admitting: Cardiology

## 2023-09-19 VITALS — BP 124/76 | HR 62 | Ht 74.0 in | Wt 202.2 lb

## 2023-09-19 DIAGNOSIS — I2510 Atherosclerotic heart disease of native coronary artery without angina pectoris: Secondary | ICD-10-CM | POA: Diagnosis not present

## 2023-09-19 DIAGNOSIS — I441 Atrioventricular block, second degree: Secondary | ICD-10-CM | POA: Diagnosis not present

## 2023-09-19 LAB — CUP PACEART INCLINIC DEVICE CHECK
Date Time Interrogation Session: 20250519204100
Implantable Lead Connection Status: 753985
Implantable Lead Connection Status: 753985
Implantable Lead Implant Date: 20220304
Implantable Lead Implant Date: 20220304
Implantable Lead Location: 753859
Implantable Lead Location: 753860
Implantable Lead Model: 5076
Implantable Lead Model: 5076
Implantable Pulse Generator Implant Date: 20220304

## 2023-09-22 ENCOUNTER — Ambulatory Visit: Payer: Self-pay | Admitting: Cardiology

## 2023-09-30 ENCOUNTER — Ambulatory Visit (INDEPENDENT_AMBULATORY_CARE_PROVIDER_SITE_OTHER): Payer: Medicare HMO

## 2023-09-30 DIAGNOSIS — I441 Atrioventricular block, second degree: Secondary | ICD-10-CM

## 2023-09-30 LAB — CUP PACEART REMOTE DEVICE CHECK
Battery Remaining Longevity: 118 mo
Battery Voltage: 3.01 V
Brady Statistic AP VP Percent: 4.46 %
Brady Statistic AP VS Percent: 0.62 %
Brady Statistic AS VP Percent: 88.44 %
Brady Statistic AS VS Percent: 6.48 %
Brady Statistic RA Percent Paced: 4.99 %
Brady Statistic RV Percent Paced: 92.9 %
Date Time Interrogation Session: 20250529204311
Implantable Lead Connection Status: 753985
Implantable Lead Connection Status: 753985
Implantable Lead Implant Date: 20220304
Implantable Lead Implant Date: 20220304
Implantable Lead Location: 753859
Implantable Lead Location: 753860
Implantable Lead Model: 5076
Implantable Lead Model: 5076
Implantable Pulse Generator Implant Date: 20220304
Lead Channel Impedance Value: 285 Ohm
Lead Channel Impedance Value: 342 Ohm
Lead Channel Impedance Value: 418 Ohm
Lead Channel Impedance Value: 817 Ohm
Lead Channel Pacing Threshold Amplitude: 0.5 V
Lead Channel Pacing Threshold Amplitude: 1 V
Lead Channel Pacing Threshold Pulse Width: 0.4 ms
Lead Channel Pacing Threshold Pulse Width: 0.4 ms
Lead Channel Sensing Intrinsic Amplitude: 3.125 mV
Lead Channel Sensing Intrinsic Amplitude: 3.125 mV
Lead Channel Sensing Intrinsic Amplitude: 5.75 mV
Lead Channel Sensing Intrinsic Amplitude: 5.75 mV
Lead Channel Setting Pacing Amplitude: 2 V
Lead Channel Setting Pacing Amplitude: 2.5 V
Lead Channel Setting Pacing Pulse Width: 0.4 ms
Lead Channel Setting Sensing Sensitivity: 1.2 mV
Zone Setting Status: 755011

## 2023-10-02 ENCOUNTER — Ambulatory Visit: Payer: Self-pay | Admitting: Cardiology

## 2023-10-04 ENCOUNTER — Ambulatory Visit

## 2023-11-17 NOTE — Progress Notes (Signed)
 Remote pacemaker transmission.

## 2024-01-03 ENCOUNTER — Ambulatory Visit (INDEPENDENT_AMBULATORY_CARE_PROVIDER_SITE_OTHER)

## 2024-01-03 DIAGNOSIS — I441 Atrioventricular block, second degree: Secondary | ICD-10-CM | POA: Diagnosis not present

## 2024-01-05 ENCOUNTER — Ambulatory Visit: Payer: Self-pay | Admitting: Cardiology

## 2024-01-05 LAB — CUP PACEART REMOTE DEVICE CHECK
Battery Remaining Longevity: 120 mo
Battery Voltage: 3.01 V
Brady Statistic AP VP Percent: 10.33 %
Brady Statistic AP VS Percent: 1.31 %
Brady Statistic AS VP Percent: 77.33 %
Brady Statistic AS VS Percent: 11.04 %
Brady Statistic RA Percent Paced: 11.39 %
Brady Statistic RV Percent Paced: 87.65 %
Date Time Interrogation Session: 20250901192852
Implantable Lead Connection Status: 753985
Implantable Lead Connection Status: 753985
Implantable Lead Implant Date: 20220304
Implantable Lead Implant Date: 20220304
Implantable Lead Location: 753859
Implantable Lead Location: 753860
Implantable Lead Model: 5076
Implantable Lead Model: 5076
Implantable Pulse Generator Implant Date: 20220304
Lead Channel Impedance Value: 1064 Ohm
Lead Channel Impedance Value: 266 Ohm
Lead Channel Impedance Value: 342 Ohm
Lead Channel Impedance Value: 399 Ohm
Lead Channel Pacing Threshold Amplitude: 0.5 V
Lead Channel Pacing Threshold Amplitude: 1.125 V
Lead Channel Pacing Threshold Pulse Width: 0.4 ms
Lead Channel Pacing Threshold Pulse Width: 0.4 ms
Lead Channel Sensing Intrinsic Amplitude: 3.375 mV
Lead Channel Sensing Intrinsic Amplitude: 3.375 mV
Lead Channel Sensing Intrinsic Amplitude: 6.125 mV
Lead Channel Sensing Intrinsic Amplitude: 6.125 mV
Lead Channel Setting Pacing Amplitude: 2 V
Lead Channel Setting Pacing Amplitude: 2.5 V
Lead Channel Setting Pacing Pulse Width: 0.4 ms
Lead Channel Setting Sensing Sensitivity: 1.2 mV
Zone Setting Status: 755011

## 2024-01-10 NOTE — Progress Notes (Signed)
 Remote PPM Transmission

## 2024-02-03 ENCOUNTER — Telehealth: Payer: Self-pay | Admitting: Cardiology

## 2024-02-03 NOTE — Telephone Encounter (Signed)
 Called the patient and he stated that he had been experiencing increased shortness of breath both at rest and during exertion over the past two weeks. He stated that he had a D-dimer that was 11,000 and that he had been sent over to [facility] to have a scan for a DVT. He stated that he had just been called and was told that the scan was negative for a clot. Patient was asking if something could be done for his shortness of breath. Please advise.

## 2024-02-03 NOTE — Telephone Encounter (Signed)
 Pt c/o Shortness Of Breath: STAT if SOB developed within the last 24 hours or pt is noticeably SOB on the phone  1. Are you currently SOB (can you hear that pt is SOB on the phone)?    2. How long have you been experiencing SOB?  2-3 weeks   3. Are you SOB when sitting or when up moving around?  When up and moving around  4. Are you currently experiencing any other symptoms?  No

## 2024-04-03 ENCOUNTER — Ambulatory Visit

## 2024-04-03 DIAGNOSIS — I441 Atrioventricular block, second degree: Secondary | ICD-10-CM

## 2024-04-04 LAB — CUP PACEART REMOTE DEVICE CHECK
Battery Remaining Longevity: 119 mo
Battery Voltage: 3 V
Brady Statistic AP VP Percent: 8.11 %
Brady Statistic AP VS Percent: 1.14 %
Brady Statistic AS VP Percent: 79.88 %
Brady Statistic AS VS Percent: 10.87 %
Brady Statistic RA Percent Paced: 9.06 %
Brady Statistic RV Percent Paced: 87.99 %
Date Time Interrogation Session: 20251202002322
Implantable Lead Connection Status: 753985
Implantable Lead Connection Status: 753985
Implantable Lead Implant Date: 20220304
Implantable Lead Implant Date: 20220304
Implantable Lead Location: 753859
Implantable Lead Location: 753860
Implantable Lead Model: 5076
Implantable Lead Model: 5076
Implantable Pulse Generator Implant Date: 20220304
Lead Channel Impedance Value: 1197 Ohm
Lead Channel Impedance Value: 285 Ohm
Lead Channel Impedance Value: 342 Ohm
Lead Channel Impedance Value: 456 Ohm
Lead Channel Pacing Threshold Amplitude: 0.5 V
Lead Channel Pacing Threshold Amplitude: 1.125 V
Lead Channel Pacing Threshold Pulse Width: 0.4 ms
Lead Channel Pacing Threshold Pulse Width: 0.4 ms
Lead Channel Sensing Intrinsic Amplitude: 3.5 mV
Lead Channel Sensing Intrinsic Amplitude: 3.5 mV
Lead Channel Sensing Intrinsic Amplitude: 6.5 mV
Lead Channel Sensing Intrinsic Amplitude: 6.5 mV
Lead Channel Setting Pacing Amplitude: 2 V
Lead Channel Setting Pacing Amplitude: 2.5 V
Lead Channel Setting Pacing Pulse Width: 0.4 ms
Lead Channel Setting Sensing Sensitivity: 1.2 mV
Zone Setting Status: 755011

## 2024-04-05 ENCOUNTER — Ambulatory Visit: Payer: Self-pay | Admitting: Cardiology

## 2024-04-06 NOTE — Progress Notes (Signed)
 Remote PPM Transmission

## 2024-07-03 ENCOUNTER — Ambulatory Visit

## 2024-07-11 ENCOUNTER — Ambulatory Visit: Admitting: Cardiology

## 2024-09-17 ENCOUNTER — Ambulatory Visit: Admitting: Cardiology

## 2024-10-02 ENCOUNTER — Ambulatory Visit
